# Patient Record
Sex: Female | Born: 1985 | Hispanic: Yes | Marital: Married | Smoking: Never smoker
Health system: Southern US, Community
[De-identification: ages and names within clinical notes are randomized; demographics above are authoritative.]

## PROBLEM LIST (undated history)

## (undated) DIAGNOSIS — K219 Gastro-esophageal reflux disease without esophagitis: Secondary | ICD-10-CM

## (undated) DIAGNOSIS — K819 Cholecystitis, unspecified: Secondary | ICD-10-CM

## (undated) DIAGNOSIS — I10 Essential (primary) hypertension: Secondary | ICD-10-CM

## (undated) HISTORY — PX: NO PAST SURGERIES: SHX2092

---

## 2017-10-23 NOTE — L&D Delivery Note (Addendum)
Delivery Note Pt came in spontaneously laboring and progressed to SVD without augmentation. At 6:17 AM a viable female was delivered via Vaginal, Spontaneous (Presentation: LOA).  APGAR: 9, 9; weight: pending. Infant placed on pt's abd and dried; cord clamped and cut at 1 min; hospital cord blood sample collected. Placenta status: spont, intact .  Cord:  3 vessel  Anesthesia:  epidural Episiotomy: None Lacerations: 1st degree Suture Repair: 3.0 monocryl Est. Blood Loss (mL):    Mom to postpartum.  Baby to Couplet care / Skin to Skin.  Cam Hai CNM 06/23/2018, 6:44 AM  Please schedule this patient for Postpartum visit in: 4 weeks with the following provider: Any provider For C/S patients schedule nurse incision check in weeks 2 weeks: no Low risk pregnancy complicated by: none Delivery mode:  SVD Anticipated Birth Control:  LARC PP Procedures needed: postpartum Pap  Schedule Integrated BH visit: no

## 2017-11-13 ENCOUNTER — Encounter: Payer: Self-pay | Admitting: *Deleted

## 2017-11-24 DIAGNOSIS — H16223 Keratoconjunctivitis sicca, not specified as Sjogren's, bilateral: Secondary | ICD-10-CM | POA: Diagnosis not present

## 2017-11-24 DIAGNOSIS — H40033 Anatomical narrow angle, bilateral: Secondary | ICD-10-CM | POA: Diagnosis not present

## 2017-12-07 ENCOUNTER — Ambulatory Visit (INDEPENDENT_AMBULATORY_CARE_PROVIDER_SITE_OTHER): Payer: Medicaid Other | Admitting: Student

## 2017-12-07 ENCOUNTER — Encounter: Payer: Self-pay | Admitting: Student

## 2017-12-07 ENCOUNTER — Other Ambulatory Visit (HOSPITAL_COMMUNITY)
Admission: RE | Admit: 2017-12-07 | Discharge: 2017-12-07 | Disposition: A | Discharge status: Home / Self Care | Payer: Medicaid Other | Source: Ambulatory Visit | Attending: Student | Admitting: Student

## 2017-12-07 DIAGNOSIS — Z3481 Encounter for supervision of other normal pregnancy, first trimester: Secondary | ICD-10-CM | POA: Insufficient documentation

## 2017-12-07 DIAGNOSIS — Z3482 Encounter for supervision of other normal pregnancy, second trimester: Secondary | ICD-10-CM

## 2017-12-07 DIAGNOSIS — Z3480 Encounter for supervision of other normal pregnancy, unspecified trimester: Secondary | ICD-10-CM | POA: Insufficient documentation

## 2017-12-07 DIAGNOSIS — Z23 Encounter for immunization: Secondary | ICD-10-CM | POA: Diagnosis not present

## 2017-12-07 LAB — POCT URINALYSIS DIP (DEVICE)
Bilirubin Urine: NEGATIVE
GLUCOSE, UA: NEGATIVE mg/dL
Hgb urine dipstick: NEGATIVE
KETONES UR: NEGATIVE mg/dL
Leukocytes, UA: NEGATIVE
Nitrite: NEGATIVE
PROTEIN: NEGATIVE mg/dL
Specific Gravity, Urine: 1.02 (ref 1.005–1.030)
UROBILINOGEN UA: 0.2 mg/dL (ref 0.0–1.0)
pH: 7 (ref 5.0–8.0)

## 2017-12-07 NOTE — Patient Instructions (Signed)
 Lactancia materna Breastfeeding Decidir amamantar es una de las mejores elecciones que puede hacer por usted y su beb. Un cambio en las hormonas durante el embarazo hace que las mamas produzcan leche materna en las glndulas productoras de leche. Las hormonas impiden que la leche materna sea liberada antes del nacimiento del beb. Adems, impulsan el flujo de leche luego del nacimiento. Una vez que ha comenzado a amamantar, pensar en el beb, as como la succin o el llanto, pueden estimular la liberacin de leche de las glndulas productoras de leche. Los beneficios de amamantar Las investigaciones demuestran que la lactancia materna ofrece muchos beneficios de salud para bebs y madres. Adems, ofrece una forma gratuita y conveniente de alimentar al beb. Para el beb  La primera leche (calostro) ayuda a mejorar el funcionamiento del aparato digestivo del beb.  Las clulas especiales de la leche (anticuerpos) ayudan a combatir las infecciones en el beb.  Los bebs que se alimentan con leche materna tambin tienen menos probabilidades de tener asma, alergias, obesidad o diabetes de tipo 2. Adems, tienen menor riesgo de sufrir el sndrome de muerte sbita del lactante (SMSL).  Los nutrientes de la leche materna son mejores para satisfacer las necesidades del beb en comparacin con la leche maternizada.  La leche materna mejora el desarrollo cerebral del beb. Para usted  La lactancia materna favorece el desarrollo de un vnculo muy especial entre la madre y el beb.  Es conveniente. La leche materna es econmica y siempre est disponible a la temperatura correcta.  La lactancia materna ayuda a quemar caloras. Le ayuda a perder el peso ganado durante el embarazo.  Hace que el tero vuelva al tamao que tena antes del embarazo ms rpido. Adems, disminuye el sangrado (loquios) despus del parto.  La lactancia materna contribuye a reducir el riesgo de tener diabetes de tipo 2,  osteoporosis, artritis reumatoide, enfermedades cardiovasculares y cncer de mama, ovario, tero y endometrio en el futuro. Informacin bsica sobre la lactancia Comienzo de la lactancia  Encuentre un lugar cmodo para sentarse o acostarse, con un buen respaldo para el cuello y la espalda.  Coloque una almohada o una manta enrollada debajo del beb para acomodarlo a la altura de la mama (si est sentada). Las almohadas para amamantar se han diseado especialmente a fin de servir de apoyo para los brazos y el beb mientras amamanta.  Asegrese de que la barriga del beb (abdomen) est frente a la suya.  Masajee suavemente la mama. Con las yemas de los dedos, masajee los bordes exteriores de la mama hacia adentro, en direccin al pezn. Esto estimula el flujo de leche. Si la leche fluye lentamente, es posible que deba continuar con este movimiento durante la lactancia.  Sostenga la mama con 4 dedos por debajo y el pulgar por arriba del pezn (forme la letra "C" con la mano). Asegrese de que los dedos se encuentren lejos del pezn y de la boca del beb.  Empuje suavemente los labios del beb con el pezn o con el dedo.  Cuando la boca del beb se abra lo suficiente, acrquelo rpidamente a la mama e introduzca todo el pezn y la arola, tanto como sea posible, dentro de la boca del beb. La arola es la zona de color que rodea al pezn. ? Debe haber ms arola visible por arriba del labio superior del beb que por debajo del labio inferior. ? Los labios del beb deben estar abiertos y extendidos hacia afuera (evertidos) para asegurar que   el beb se prenda de forma adecuada y cmoda. ? La lengua del beb debe estar entre la enca inferior y la mama.  Asegrese de que la boca del beb est en la posicin correcta alrededor del pezn (prendido). Los labios del beb deben crear un sello sobre la mama y estar doblados hacia afuera (invertidos).  Es comn que el beb succione durante 2 a 3 minutos  para que comience el flujo de leche materna. Cmo debe prenderse Es muy importante que le ensee al beb cmo prenderse adecuadamente a la mama. Si el beb no se prende adecuadamente, puede causar dolor en los pezones, reducir la produccin de leche materna y hacer que el beb tenga un escaso aumento de peso. Adems, si el beb no se prende adecuadamente al pezn, puede tragar aire durante la alimentacin. Esto puede causarle molestias al beb. Hacer eructar al beb al cambiar de mama puede ayudarlo a liberar el aire. Sin embargo, ensearle al beb cmo prenderse a la mama adecuadamente es la mejor manera de evitar que se sienta molesto por tragar aire mientras se alimenta. Signos de que el beb se ha prendido adecuadamente al pezn  Tironea o succiona de modo silencioso, sin causarle dolor. Los labios del beb deben estar extendidos hacia afuera (evertidos).  Se escucha que traga cada 3 o 4 succiones una vez que la leche ha comenzado a fluir (despus de que se produzca el reflejo de eyeccin de la leche).  Hay movimientos musculares por arriba y por delante de sus odos al succionar.  Signos de que el beb no se ha prendido adecuadamente al pezn  Hace ruidos de succin o de chasquido mientras se alimenta.  Siente dolor en los pezones.  Si cree que el beb no se prendi correctamente, deslice el dedo en la comisura de la boca y colquelo entre las encas del beb para interrumpir la succin. Intente volver a comenzar a amamantar. Signos de lactancia materna exitosa Signos del beb  El beb disminuir gradualmente el nmero de succiones o dejar de succionar por completo.  El beb se quedar dormido.  El cuerpo del beb se relajar.  El beb retendr una pequea cantidad de leche en la boca.  El beb se desprender solo del pecho.  Signos que presenta usted  Las mamas han aumentado la firmeza, el peso y el tamao 1 a 3 horas despus de amamantar.  Estn ms blandas inmediatamente  despus de amamantar.  Se producen un aumento del volumen de leche y un cambio en su consistencia y color hacia el quinto da de lactancia.  Los pezones no duelen, no estn agrietados ni sangran.  Signos de que su beb recibe la cantidad de leche suficiente  Mojar por lo menos 1 o 2paales durante las primeras 24horas despus del nacimiento.  Mojar por lo menos 5 o 6paales cada 24horas durante la primera semana despus del nacimiento. La orina debe ser clara o de color amarillo plido a los 5das de vida.  Mojar entre 6 y 8paales cada 24horas a medida que el beb sigue creciendo y desarrollndose.  Defeca por lo menos 3 veces en 24 horas a los 5 das de vida. Las heces deben ser blandas y amarillentas.  Defeca por lo menos 3 veces en 24 horas a los 7 das de vida. Las heces deben ser grumosas y amarillentas.  No registra una prdida de peso mayor al 10% del peso al nacer durante los primeros 3 das de vida.  Aumenta de peso un   promedio de 4 a 7onzas (113 a 198g) por semana despus de los 4 das de vida.  Aumenta de peso, diariamente, de manera uniforme a partir de los 5 das de vida, sin registrar prdida de peso despus de las 2semanas de vida. Despus de alimentarse, es posible que el beb regurgite una pequea cantidad de leche. Esto es normal. Frecuencia y duracin de la lactancia El amamantamiento frecuente la ayudar a producir ms leche y puede prevenir dolores en los pezones y las mamas extremadamente llenas (congestin mamaria). Alimente al beb cuando muestre signos de hambre o si siente la necesidad de reducir la congestin de las mamas. Esto se denomina "lactancia a demanda". Las seales de que el beb tiene hambre incluyen las siguientes:  Aumento del estado de alerta, actividad o inquietud.  Mueve la cabeza de un lado a otro.  Abre la boca cuando se le toca la mejilla o la comisura de la boca (reflejo de bsqueda).  Aumenta las vocalizaciones, tales como  sonidos de succin, se relame los labios, emite arrullos, suspiros o chirridos.  Mueve la mano hacia la boca y se chupa los dedos o las manos.  Est molesto o llora.  Evite el uso del chupete en las primeras 4 a 6 semanas despus del nacimiento del beb. Despus de este perodo, podr usar un chupete. Las investigaciones demostraron que el uso del chupete durante el primer ao de vida del beb disminuye el riesgo de tener el sndrome de muerte sbita del lactante (SMSL). Permita que el nio se alimente en cada mama todo lo que desee. Cuando el beb se desprende o se queda dormido mientras se est alimentando de la primera mama, ofrzcale la segunda. Debido a que, con frecuencia, los recin nacidos estn somnolientos las primeras semanas de vida, es posible que deba despertar al beb para alimentarlo. Los horarios de lactancia varan de un beb a otro. Sin embargo, las siguientes reglas pueden servir como gua para ayudarla a garantizar que el beb se alimenta adecuadamente:  Se puede amamantar a los recin nacidos (bebs de 4 semanas o menos de vida) cada 1 a 3 horas.  No deben transcurrir ms de 3 horas durante el da o 5 horas durante la noche sin que se amamante a los recin nacidos.  Debe amamantar al beb un mnimo de 8 veces en un perodo de 24 horas.  Extraccin de leche materna La extraccin y el almacenamiento de la leche materna le permiten asegurarse de que el beb se alimente exclusivamente de su leche materna, aun en momentos en los que no puede amamantar. Esto tiene especial importancia si debe regresar al trabajo en el perodo en que an est amamantando o si no puede estar presente en los momentos en que el beb debe alimentarse. Su asesor en lactancia puede ayudarla a encontrar un mtodo de extraccin que funcione mejor para usted y orientarla sobre cunto tiempo es seguro almacenar leche materna. Cmo cuidar las mamas durante la lactancia Los pezones pueden secarse, agrietarse y  doler durante la lactancia. Las siguientes recomendaciones pueden ayudarla a mantener las mamas humectadas y sanas:  Evite usar jabn en los pezones.  Use un sostn de soporte diseado especialmente para la lactancia materna. Evite usar sostenes con aro o sostenes muy ajustados (sostenes deportivos).  Seque al aire sus pezones durante 3 a 4minutos despus de amamantar al beb.  Utilice solo apsitos de algodn en el sostn para absorber las prdidas de leche. La prdida de un poco de leche materna   entre las tomas es normal.  Utilice lanolina sobre los pezones luego de amamantar. La lanolina ayuda a mantener la humedad normal de la piel. La lanolina pura no es perjudicial (no es txica) para el beb. Adems, puede extraer manualmente algunas gotas de leche materna y masajear suavemente esa leche sobre los pezones para que la leche se seque al aire.  Durante las primeras semanas despus del nacimiento, algunas mujeres experimentan congestin mamaria. La congestin mamaria puede hacer que sienta las mamas pesadas, calientes y sensibles al tacto. El pico de la congestin mamaria ocurre en el plazo de los 3 a 5 das despus del parto. Las siguientes recomendaciones pueden ayudarla a aliviar la congestin mamaria:  Vace por completo las mamas al amamantar o extraer leche. Puede aplicar calor hmedo en las mamas (en la ducha o con toallas hmedas para manos) antes de amamantar o extraer leche. Esto aumenta la circulacin y ayuda a que la leche fluya. Si el beb no vaca por completo las mamas cuando lo amamanta, extraiga la leche restante despus de que haya finalizado.  Aplique compresas de hielo sobre las mamas inmediatamente despus de amamantar o extraer leche, a menos que le resulte demasiado incmodo. Haga lo siguiente: ? Ponga el hielo en una bolsa plstica. ? Coloque una toalla entre la piel y la bolsa de hielo. ? Coloque el hielo durante 20minutos, 2 o 3veces por da.  Asegrese de que el  beb est prendido y se encuentre en la posicin correcta mientras lo alimenta.  Si la congestin mamaria persiste luego de 48 horas o despus de seguir estas recomendaciones, comunquese con su mdico o un asesor en lactancia. Recomendaciones de salud general durante la lactancia  Consuma 3 comidas y 3 colaciones saludables todos los das. Las madres bien alimentadas que amamantan necesitan entre 450 y 500 caloras adicionales por da. Puede cumplir con este requisito al aumentar la cantidad de una dieta equilibrada que realice.  Beba suficiente agua para mantener la orina clara o de color amarillo plido.  Descanse con frecuencia, reljese y siga tomando sus vitaminas prenatales para prevenir la fatiga, el estrs y los niveles bajos de vitaminas y minerales en el cuerpo (deficiencias de nutrientes).  No consuma ningn producto que contenga nicotina o tabaco, como cigarrillos y cigarrillos electrnicos. El beb puede verse afectado por las sustancias qumicas de los cigarrillos que pasan a la leche materna y por la exposicin al humo ambiental del tabaco. Si necesita ayuda para dejar de fumar, consulte al mdico.  Evite el consumo de alcohol.  No consuma drogas ilegales o marihuana.  Antes de usar cualquier medicamento, hable con el mdico. Estos incluyen medicamentos recetados y de venta libre, como tambin vitaminas y suplementos a base de hierbas. Algunos medicamentos, que pueden ser perjudiciales para el beb, pueden pasar a travs de la leche materna.  Puede quedar embarazada durante la lactancia. Si se desea un mtodo anticonceptivo, consulte al mdico sobre cules son las opciones seguras durante la lactancia. Dnde encontrar ms informacin: Liga internacional [place]eche: www.llli.org. Comunquese con un mdico si:  Siente que quiere dejar de amamantar o se siente frustrada con la lactancia.  Sus pezones estn agrietados o sangran.  Sus mamas estn irritadas, sensibles o  calientes.  Tiene los siguientes sntomas: ? Dolor en las mamas o en los pezones. ? Un rea hinchada en cualquiera de las mamas. ? Fiebre o escalofros. ? Nuseas o vmitos. ? Drenaje de otro lquido distinto de la leche materna desde los pezones.    Sus mamas no se llenan antes de amamantar al beb para el quinto da despus del parto.  Se siente triste y deprimida.  El beb: ? Est demasiado somnoliento como para comer bien. ? Tiene problemas para dormir. ? Tiene ms de 1 semana de vida y moja menos de 6 paales en un periodo de 24 horas. ? No ha aumentado de peso a los 5 das de vida.  El beb defeca menos de 3 veces en 24 horas.  La piel del beb o las partes blancas de los ojos se vuelven amarillentas. Solicite ayuda de inmediato si:  El beb est muy cansado (letargo) y no se quiere despertar para comer.  Le sube la fiebre sin causa. Resumen  La lactancia materna ofrece muchos beneficios de salud para bebs y madres.  Intente amamantar a su beb cuando muestre signos tempranos de hambre.  Haga cosquillas o empuje suavemente los labios del beb con el dedo o el pezn para lograr que el beb abra la boca. Acerque el beb a la mama. Asegrese de que la mayor parte de la arola se encuentre dentro de la boca del beb. Ofrzcale una mama y haga eructar al beb antes de pasar a la otra.  Hable con su mdico o asesor en lactancia si tiene dudas o problemas con la lactancia. Esta informacin no tiene como fin reemplazar el consejo del mdico. Asegrese de hacerle al mdico cualquier pregunta que tenga. Document Released: 10/09/2005 Document Revised: 01/29/2017 Document Reviewed: 01/29/2017 Elsevier Interactive Patient Education  2018 Elsevier Inc.  

## 2017-12-07 NOTE — Progress Notes (Signed)
  Subjective:    Loretta [name] is being seen today for her first obstetrical visit.  This is not a planned pregnancy. She is at [id number] gestation. Her obstetrical history is significant for nothing. . Relationship with FOB: spouse, living together. Patient does intend to breast feed. Pregnancy history fully reviewed.  Patient reports no complaints.  Review of Systems:   Review of Systems  Constitutional: Negative.   HENT: Negative.   Respiratory: Negative.   Cardiovascular: Negative.   Gastrointestinal: Negative.   Genitourinary: Negative.   Musculoskeletal: Negative.   Neurological: Negative.   Hematological: Negative.     Objective:     BP 129/77   Pulse (!) 108   Ht 4\' 11"  (1.499 m)   Wt 132 lb 6.4 oz (60.1 kg)   LMP 09/10/2017 (Exact Date)   BMI 26.74 kg/m  Physical Exam  Constitutional: She is oriented to person, place, and time. She appears well-developed.  HENT:  Head: Normocephalic.  Eyes: Pupils are equal, round, and reactive to light.  Neck: Normal range of motion.  Respiratory: Effort normal.  GI: Soft.  Musculoskeletal: Normal range of motion.  Neurological: She is alert and oriented to person, place, and time.  Skin: Skin is warm and dry.  Psychiatric: She has a normal mood and affect.    Exam    Assessment:    Pregnancy: [id number] Patient Active Problem List   Diagnosis Date Noted  . Supervision of other normal pregnancy, antepartum 12/07/2017       Plan:     Initial labs drawn. Prenatal vitamins. Problem list reviewed and updated. AFP3 discussed: NIPS  Role of ultrasound in pregnancy discussed; fetal survey: ordered. Amniocentesis discussed: not indicated. Follow up in 8 weeks. 50% of 30 min visit spent on counseling and coordination of care.  -Pap today -oriented patient to practice -reviewed prenatal course in [place] -baby RX optimization -HgbA1c -Follow up in 4 weeks Loretta [name] 12/07/2017

## 2017-12-10 LAB — URINE CULTURE, OB REFLEX

## 2017-12-10 LAB — CULTURE, OB URINE

## 2017-12-11 LAB — CYTOLOGY - PAP
Chlamydia: NEGATIVE
Diagnosis: NEGATIVE
HPV: DETECTED — AB
NEISSERIA GONORRHEA: NEGATIVE

## 2017-12-12 ENCOUNTER — Encounter: Payer: Self-pay | Admitting: *Deleted

## 2017-12-17 ENCOUNTER — Encounter: Payer: Self-pay | Admitting: *Deleted

## 2017-12-17 LAB — OBSTETRIC PANEL, INCLUDING HIV
Antibody Screen: NEGATIVE
Basophils Absolute: 0 10*3/uL (ref 0.0–0.2)
Basos: 0 %
EOS (ABSOLUTE): 0 10*3/uL (ref 0.0–0.4)
EOS: 0 %
HEMOGLOBIN: 12.1 g/dL (ref 11.1–15.9)
HEP B S AG: NEGATIVE
HIV SCREEN 4TH GENERATION: NONREACTIVE
Hematocrit: 36.5 % (ref 34.0–46.6)
Immature Grans (Abs): 0 10*3/uL (ref 0.0–0.1)
Immature Granulocytes: 0 %
LYMPHS ABS: 1.7 10*3/uL (ref 0.7–3.1)
Lymphs: 22 %
MCH: 28.8 pg (ref 26.6–33.0)
MCHC: 33.2 g/dL (ref 31.5–35.7)
MCV: 87 fL (ref 79–97)
MONOS ABS: 0.3 10*3/uL (ref 0.1–0.9)
Monocytes: 4 %
NEUTROS ABS: 5.5 10*3/uL (ref 1.4–7.0)
Neutrophils: 74 %
Platelets: 358 10*3/uL (ref 150–379)
RBC: 4.2 x10E6/uL (ref 3.77–5.28)
RDW: 13.6 % (ref 12.3–15.4)
RH TYPE: POSITIVE
RPR: NONREACTIVE
Rubella Antibodies, IGG: 16.7 index (ref >0.99)
WBC: 7.5 10*3/uL (ref 3.4–10.8)

## 2017-12-17 LAB — SMN1 COPY NUMBER ANALYSIS (SMA CARRIER SCREENING)

## 2017-12-17 LAB — HEMOGLOBINOPATHY EVALUATION
Ferritin: 86 ng/mL (ref 15–150)
HGB A2 QUANT: 2.1 % (ref 1.8–3.2)
HGB A: 97.1 % (ref 96.4–98.8)
HGB F QUANT: 0.8 % (ref 0.0–2.0)
HGB S: 0 %
HGB SOLUBILITY: NEGATIVE
Hgb C: 0 %
Hgb Variant: 0 %

## 2017-12-17 LAB — HEMOGLOBIN A1C
Est. average glucose Bld gHb Est-mCnc: 103 mg/dL
Hgb A1c MFr Bld: 5.2 % (ref 4.8–5.6)

## 2017-12-27 ENCOUNTER — Other Ambulatory Visit (HOSPITAL_COMMUNITY)
Admission: RE | Admit: 2017-12-27 | Discharge: 2017-12-27 | Disposition: A | Discharge status: Home / Self Care | Payer: Medicaid Other | Source: Ambulatory Visit | Attending: Family Medicine | Admitting: Family Medicine

## 2017-12-27 ENCOUNTER — Ambulatory Visit: Payer: Medicaid Other | Admitting: Family Medicine

## 2017-12-27 ENCOUNTER — Other Ambulatory Visit: Payer: Self-pay

## 2017-12-27 ENCOUNTER — Encounter: Payer: Self-pay | Admitting: Family Medicine

## 2017-12-27 VITALS — BP 110/60 | HR 104 | Temp 98.7°F | Ht 62.5 in | Wt 136.0 lb

## 2017-12-27 DIAGNOSIS — N898 Other specified noninflammatory disorders of vagina: Secondary | ICD-10-CM | POA: Insufficient documentation

## 2017-12-27 DIAGNOSIS — K219 Gastro-esophageal reflux disease without esophagitis: Secondary | ICD-10-CM | POA: Diagnosis not present

## 2017-12-27 DIAGNOSIS — Z0000 Encounter for general adult medical examination without abnormal findings: Secondary | ICD-10-CM

## 2017-12-27 LAB — POCT WET PREP (WET MOUNT)
CLUE CELLS WET PREP WHIFF POC: NEGATIVE
TRICHOMONAS WET PREP HPF POC: ABSENT

## 2017-12-27 MED ORDER — NYSTATIN 100000 UNIT/GM EX OINT
1.0000 | TOPICAL_OINTMENT | Freq: Two times a day (BID) | CUTANEOUS | 0 refills | Status: DC
Start: 2017-12-27 — End: 2018-03-12

## 2017-12-27 MED ORDER — RANITIDINE HCL 150 MG PO CAPS: 150.0000 mg | ORAL_CAPSULE | Freq: Two times a day (BID) | ORAL | 1 refills | Status: DC

## 2017-12-27 NOTE — Assessment & Plan Note (Signed)
  Advised discontinuation of PPI in pregnancy Rx sent in for ranitidine to pharmacy to use BID Can also try tums OTC Follow up with OB as scheduled next week

## 2017-12-27 NOTE — Patient Instructions (Addendum)
Fue un placer conocerte hoy! Por favor, tome la medicacin prescrita para el reflujo cido dos veces al da. Tambin puede tomar TUMS si tiene reflujo seguro durante el embarazo Envi una crema para usar en la piel alrededor de la vagina donde pica. Por favor haga un seguimiento con su proveedor de OB segn lo programado. Si tiene alguna pregunta o inquietud, llmenos al [phone].  Gracias, Dr. Andria Rheiniccio   Segundo trimestre de embarazo (Second Trimester of Pregnancy) El segundo trimestre va desde la semana13 hasta la 28, desde el cuarto hasta el sexto mes, y suele ser el momento en el que mejor se siente. En general, las nuseas matutinas han disminuido o han desaparecido completamente. Tendr ms energa y podr aumentarle el apetito. El beb por nacer (feto) se desarrolla rpidamente. Hacia el final del sexto mes, el beb mide aproximadamente 9 pulgadas (23 cm) y pesa alrededor de 1 libras (700 g). Es probable que sienta al beb moverse (dar pataditas) entre las 18 y 20 semanas del Psychiatristembarazo. CUIDADOS EN EL HOGAR  No fume, no consuma hierbas ni beba alcohol. No tome frmacos que el mdico no haya autorizado.  No consuma ningn producto que contenga tabaco, lo que incluye cigarrillos, tabaco de Theatre managermascar o Administrator, Civil Servicecigarrillos electrnicos. Si necesita ayuda para dejar de fumar, consulte al American Expressmdico. Puede recibir asesoramiento u otro tipo de apoyo para dejar de fumar.  Tome los medicamentos solamente como se lo haya indicado el mdico. Algunos medicamentos son seguros para tomar durante el Psychiatristembarazo y otros no lo son.  Haga ejercicios solamente como se lo haya indicado el mdico. Interrumpa la actividad fsica si comienza a tener calambres.  Ingiera alimentos saludables de Plantationmanera regular.  Use un sostn que le brinde buen soporte si sus mamas estn sensibles.  No se d baos de inmersin en agua caliente, baos turcos ni saunas.  Colquese el cinturn de seguridad cuando conduzca.  No coma  carne cruda ni queso sin cocinar; evite el contacto con las bandejas sanitarias de los gatos y la tierra que estos animales usan.  Tome las vitaminas prenatales.  Tome entre 1500 y 2000mg  de calcio diariamente comenzando en la semana20 del embarazo [place] el parto.  Pruebe tomar un medicamento que la ayude a defecar (un laxante suave) si el mdico lo autoriza. Consuma ms fibra, que se encuentra en las frutas y verduras frescas y los cereales integrales. Beba suficiente lquido para mantener el pis (orina) claro o de color amarillo plido.  Dese baos de asiento con agua tibia para Engineer, materialsaliviar el dolor o las molestias causadas por las hemorroides. Use una crema para las hemorroides si el mdico la autoriza.  Si se le hinchan las venas (venas varicosas), use medias de descanso. Levante (eleve) los pies durante 15minutos, 3 o 4veces por Futures traderda. Limite el consumo de sal en su dieta.  No levante objetos pesados, use zapatos de tacones bajos y sintese derecha.  Descanse con las piernas elevadas si tiene calambres o dolor de cintura.  Visite a su dentista si no lo ha Occupational hygienisthecho durante el embarazo. Use un cepillo de cerdas suaves para cepillarse los dientes. Psese el hilo dental con suavidad.  Puede seguir Calpine Corporationmanteniendo relaciones sexuales, a menos que el mdico le indique lo contrario.  Concurra a los controles mdicos.  SOLICITE AYUDA SI:  Siente mareos.  Sufre calambres o presin leves en la parte baja del vientre (abdomen).  Sufre un dolor persistente en el abdomen.  Tiene Programme researcher, broadcasting/film/videomalestar estomacal (nuseas), vmitos, o tiene deposiciones acuosas (diarrea).  Advierte un olor ftido que proviene de la vagina.  Siente dolor al [employer].  SOLICITE AYUDA DE INMEDIATO SI:  Tiene fiebre.  Tiene una prdida de lquido por la vagina.  Tiene sangrado o pequeas prdidas vaginales.  Siente dolor intenso o clicos en el abdomen.  Sube o baja de peso rpidamente.  Tiene dificultades para recuperar el  aliento y siente dolor en el pecho.  Sbitamente se le hinchan mucho el rostro, las [place], los tobillos, los pies o las piernas.  No ha sentido los movimientos del beb durante Georgianne Fick.  Siente un dolor de cabeza intenso que no se alivia con medicamentos.  Su visin se modifica.  Esta informacin no tiene Theme park manager el consejo del mdico. Asegrese de hacerle al mdico cualquier pregunta que tenga. Document Released: 06/11/2013 Document Revised: 10/30/2014 Document Reviewed: 12/10/2012 Elsevier Interactive Patient Education  2017 [employer].

## 2017-12-27 NOTE — Assessment & Plan Note (Signed)
  Wet prep positive only for bacteria. No urinary symptoms. Sent Gc/chalmydia to lab. Advised use of antifungal cream on skin around vagina that is itchy to patient, however I did not appreciate any lesions or rashes on my exam. She has appointment with OB next week, advised her to discuss this with them if symptoms persist. Antifungal ointment sent to pharmacy.

## 2017-12-27 NOTE — Progress Notes (Signed)
    Subjective:    Patient ID: Loretta [name], female    DOB: 28-Nov-1985, 32 y.o.   MRN: [id number]   CC: establish care  Currently pregnant. [facility]   PMH- reflux Meds- PPI  Allergies- none Surg Hx- none FH- none that she knows of [facility]- lives with her daughters and husband, no tobacco, alcohol, or drug use  GERD- taking PPI lanzoprazole - w/o relief. Denies using or trying other medications.   Vaginal itching- for past couple of days. Reports itching is around her vagina. Endorses discharge. No dysuria. No pain. No vaginal bleeding. This has happened before, but she can't remember what happened at that time.  Smoking status reviewed- non-smoker  Review of Systems- see HPI   Objective:  BP 110/60   Pulse (!) 104   Temp 98.7 F (37.1 C) (Oral)   Ht 5' 2.5" (1.588 m)   Wt 136 lb (61.7 kg)   LMP 09/10/2017 (Exact Date)   SpO2 99%   BMI 24.48 kg/m  Vitals and nursing note reviewed  General: well nourished, in no acute distress HEENT: normocephalic, no scleral icterus or conjunctival pallor, no nasal discharge, moist mucous membranes, good dentition without erythema or discharge noted in posterior oropharynx Neck: supple, non-tender, without lymphadenopathy Cardiac: RRR, clear S1 and S2, no murmurs, rubs, or gallops Respiratory: clear to auscultation bilaterally, no increased work of breathing Abdomen: soft, nontender, nondistended, no masses or organomegaly. Bowel sounds present GU: normal female external genitalia, no rashes or lesions appreciated, moderate amount of thick white discharge present, cervix visualized closed, no CMT Extremities: no edema or cyanosis. Skin: warm and dry, no rashes noted Neuro: alert and oriented, no focal deficits   Assessment & Plan:    Vaginal discharge  Wet prep positive only for bacteria. No urinary symptoms. Sent Gc/chalmydia to lab. Advised use of antifungal cream on skin  around vagina that is itchy to patient, however I did not appreciate any lesions or rashes on my exam. She has appointment with OB next week, advised her to discuss this with them if symptoms persist. Antifungal ointment sent to pharmacy.   Gastroesophageal reflux disease  Advised discontinuation of PPI in pregnancy Rx sent in for ranitidine to pharmacy to use BID Can also try tums OTC Follow up with OB as scheduled next week    Return in about 1 year (around 12/28/2018), or as needed.   Dolores PattyAngela Braylin Xu, DO Family Medicine Resident PGY-2

## 2017-12-29 LAB — CERVICOVAGINAL ANCILLARY ONLY
CHLAMYDIA, DNA PROBE: NEGATIVE
NEISSERIA GONORRHEA: NEGATIVE

## 2017-12-31 ENCOUNTER — Encounter: Payer: Self-pay | Admitting: *Deleted

## 2017-12-31 NOTE — Progress Notes (Signed)
Please let Ms. Avalos know her testing was negative for gonorrhea/chlamydia. Thanks!

## 2018-01-03 ENCOUNTER — Encounter: Payer: Medicaid Other | Admitting: Student

## 2018-01-07 ENCOUNTER — Encounter (HOSPITAL_COMMUNITY): Payer: Self-pay | Admitting: Student

## 2018-01-14 ENCOUNTER — Ambulatory Visit (INDEPENDENT_AMBULATORY_CARE_PROVIDER_SITE_OTHER): Payer: Medicaid Other | Admitting: Advanced Practice Midwife

## 2018-01-14 ENCOUNTER — Other Ambulatory Visit: Payer: Self-pay | Admitting: Student

## 2018-01-14 ENCOUNTER — Ambulatory Visit (HOSPITAL_COMMUNITY)
Admission: RE | Admit: 2018-01-14 | Discharge: 2018-01-14 | Disposition: A | Discharge status: Home / Self Care | Payer: Medicaid Other | Source: Ambulatory Visit | Attending: Student | Admitting: Student

## 2018-01-14 ENCOUNTER — Encounter: Payer: Self-pay | Admitting: Advanced Practice Midwife

## 2018-01-14 VITALS — BP 128/80 | HR 102 | Wt 136.0 lb

## 2018-01-14 DIAGNOSIS — Z3492 Encounter for supervision of normal pregnancy, unspecified, second trimester: Secondary | ICD-10-CM | POA: Insufficient documentation

## 2018-01-14 DIAGNOSIS — Z3480 Encounter for supervision of other normal pregnancy, unspecified trimester: Secondary | ICD-10-CM

## 2018-01-14 DIAGNOSIS — Z363 Encounter for antenatal screening for malformations: Secondary | ICD-10-CM | POA: Diagnosis not present

## 2018-01-14 DIAGNOSIS — Z3A18 18 weeks gestation of pregnancy: Secondary | ICD-10-CM | POA: Insufficient documentation

## 2018-01-14 DIAGNOSIS — O359XX0 Maternal care for (suspected) fetal abnormality and damage, unspecified, not applicable or unspecified: Secondary | ICD-10-CM | POA: Diagnosis not present

## 2018-01-14 NOTE — Progress Notes (Signed)
Patient ID: Loretta [name], female   DOB: [birth year], 32 y.o.   MRN: [id number]   PRENATAL VISIT NOTE  Subjective:  Loretta [name] is a 32 y.o. G3P2002 at 4628w0d being seen today for ongoing prenatal care.  She is currently monitored for the following issues for this low-risk pregnancy and has Supervision of other normal pregnancy, antepartum; Vaginal discharge; and Gastroesophageal reflux disease on their problem list.  Patient reports no complaints.  Contractions: Not present. Vag. Bleeding: None.  Movement: Present. Denies leaking of fluid.   The following portions of the patient's history were reviewed and updated as appropriate: allergies, current medications, past family history, past medical history, past social history, past surgical history and problem list. Problem list updated.  Objective:   Vitals:   01/14/18 0910  BP: 128/80  Pulse: (!) 102  Weight: 136 lb (61.7 kg)    Fetal Status:   Fundal Height: 18 cm Movement: Present     General:  Alert, oriented and cooperative. Patient is in no acute distress.  Skin: Skin is warm and dry. No rash noted.   Cardiovascular: Normal heart rate noted  Respiratory: Normal respiratory effort, no problems with respiration noted  Abdomen: Soft, gravid, appropriate for gestational age.  Pain/Pressure: Present     Pelvic: Cervical exam deferred        Extremities: Normal range of motion.  Edema: None  Mental Status:  Normal mood and affect. Normal behavior. Normal judgment and thought content.   Assessment and Plan:  Pregnancy: G3P2002 at 7128w0d  1. Supervision of other normal pregnancy, antepartum  - AFP, Serum, Open Spina Bifida - US scheduled   Preterm labor symptoms and general obstetric precautions including but not limited to vaginal bleeding, contractions, leaking of fluid and fetal movement were reviewed in detail with the patient. Please refer to After Visit Summary for other counseling  recommendations.  Return in about 1 month (around 02/11/2018).   Thressa ShellerHeather Hogan, CNM

## 2018-01-14 NOTE — Progress Notes (Signed)
Spanish Interpreter Cicero Duckrika  Educated pt on Breastfeeding for Six months

## 2018-01-15 ENCOUNTER — Ambulatory Visit: Payer: Medicaid Other

## 2018-01-16 LAB — AFP, SERUM, OPEN SPINA BIFIDA
AFP MoM: 0.69
AFP VALUE AFPOSL: 31.2 ng/mL
Gest. Age on Collection Date: 18 weeks
MATERNAL AGE AT EDD: 31.1 a
OSBR Risk 1 IN: 10000
Test Results:: NEGATIVE
Weight: 136 [lb_av]

## 2018-02-12 ENCOUNTER — Ambulatory Visit (INDEPENDENT_AMBULATORY_CARE_PROVIDER_SITE_OTHER): Payer: Medicaid Other | Admitting: Obstetrics and Gynecology

## 2018-02-12 VITALS — BP 112/69 | HR 102 | Wt 138.3 lb

## 2018-02-12 DIAGNOSIS — Z3480 Encounter for supervision of other normal pregnancy, unspecified trimester: Secondary | ICD-10-CM

## 2018-02-12 DIAGNOSIS — Z3009 Encounter for other general counseling and advice on contraception: Secondary | ICD-10-CM

## 2018-02-12 DIAGNOSIS — Z758 Other problems related to medical facilities and other health care: Secondary | ICD-10-CM

## 2018-02-12 DIAGNOSIS — Z789 Other specified health status: Secondary | ICD-10-CM | POA: Insufficient documentation

## 2018-02-12 NOTE — Patient Instructions (Signed)
Lo que debe saber sobre la esterilizacin en las mujeres (What You Need to Know About Female Sterilization) La esterilizacin en la mujer es una ciruga que se realiza para evitar el embarazo. En esta ciruga, las trompas de Falopio se obstruyen o se cierran. Esto evita que los vulos lleguen al tero y que los fecunde un espermatozoide; en consecuencia, se previene el embarazo. La esterilizacin es permanente. Solo debe hacerse si est segura de que no desea tener hijos. CULES SON LAS OPCIONES QUIRRGICAS PARA LA ESTERILIZACIN? Hay varias clases de cirugas para esterilizacin en la mujer. Estos incluyen los siguientes:  Ligadura laparoscpica de trompas. En esta ciruga, las trompas de Falopio se atan, se sellan con calor o se obstruyen con un clip, un anillo o una abrazadera. Tambin puede extraerse una parte pequea de cada trompa de Falopio. Esta ciruga se realiza a travs de varios cortes (incisiones) pequeos.  Ligadura de trompas posparto. Esto tambin se denomina minilaparotoma. La ciruga se realiza de inmediato despus del nacimiento, o 1o 2das despus del nacimiento. En esta ciruga, las trompas de Falopio se atan, se sellan con calor o se obstruyen con un clip, un anillo o una abrazadera. Tambin puede extraerse una parte pequea de cada trompa de Falopio. La ciruga se realiza a travs de una sola incisin.  Esterilizacin histeroscpica. En esta ciruga, se coloca un diminuto resorte helicoidal a travs del cuello uterino y el tero hasta las trompas de Falopio. El resorte produce cicatrizacin patolgica, lo que obstruye las trompas. Despus de la ciruga, se deben utilizar anticonceptivos durante 3meses para permitir que el tejido cicatricial se forme por completo. LA ESTERILIZACIN ES UN PROCEDIMIENTO SEGURO? En general, la esterilizacin es segura. Las complicaciones son raras. No obstante, hay algunos riesgos. Estos incluyen los  siguientes:  Hemorragia.  Infeccin.  Reaccin al medicamento utilizado durante el procedimiento.  Lesin en los rganos circundantes.  Fallas en el procedimiento. LA ESTERILIZACIN ES UN MTODO EFECTIVO? La esterilizacin es efectiva en casi 100%, pero puede fallar. Adems, con el tiempo, las trompas de Falopio pueden volver a desarrollarse. Si esto sucede, podr quedar embarazada nuevamente. Los mujeres que se realizan este procedimiento tienen ms probabilidades de sufrir un embarazo ectpico. El embarazo ectpico es aquel que se produce fuera del tero. Esta clase de embarazo no tiene resultados satisfactorios y puede causar sangrado grave si no se trata. CULES SON LOS BENEFICIOS?  En general, es efectiva para toda la vida.  Suele ser segura.  No presenta los inconvenientes de otros tipos de mtodos anticonceptivos: Esto significa lo siguiente: ? No afecta a las hormonas. Por lo tanto, no influye en los perodos menstruales, en el deseo ni en el rendimiento sexual. ? No tiene efectos secundarios. CULES SON LOS INCONVENIENTES?  Si cambia de parecer y decide que quiere tener hijos, no podr hacerlo. La esterilizacin puede revertirse, pero esto no siempre es exitoso.  No ofrece proteccin contra las ETS (enfermedades de transmisin sexual).  Aumenta la probabilidad de tener un embarazo ectpico. Esta informacin no tiene como fin reemplazar el consejo del mdico. Asegrese de hacerle al mdico cualquier pregunta que tenga. Document Released: 03/27/2008 Document Revised: 01/31/2016 Document Reviewed: 07/06/2015 Elsevier Interactive Patient Education  2018 Elsevier Inc.  

## 2018-02-12 NOTE — Progress Notes (Signed)
Subjective:  Loretta [name] is a 32 y.o. G3P2002 at 5269w1d being seen today for ongoing prenatal care.  She is currently monitored for the following issues for this low-risk pregnancy and has Supervision of other normal pregnancy, antepartum; Vaginal discharge; Gastroesophageal reflux disease; Unwanted fertility; and Language barrier on their problem list.  Patient reports no complaints.  Contractions: Not present. Vag. Bleeding: None.  Movement: Present. Denies leaking of fluid.   The following portions of the patient's history were reviewed and updated as appropriate: allergies, current medications, past family history, past medical history, past social history, past surgical history and problem list. Problem list updated.  Objective:   Vitals:   02/12/18 0832  BP: 112/69  Pulse: (!) 102  Weight: 62.7 kg (138 lb 4.8 oz)    Fetal Status: Fetal Heart Rate (bpm): 145 Fundal Height: 21 cm Movement: Present     General:  Alert, oriented and cooperative. Patient is in no acute distress.  Skin: Skin is warm and dry. No rash noted.   Cardiovascular: Normal heart rate noted  Respiratory: Normal respiratory effort, no problems with respiration noted  Abdomen: Soft, gravid, appropriate for gestational age. Pain/Pressure: Absent     Pelvic: Vag. Bleeding: None     Cervical exam deferred        Extremities: Normal range of motion.  Edema: None  Mental Status: Normal mood and affect. Normal behavior. Normal judgment and thought content.   Urinalysis:      Assessment and Plan:  Pregnancy: G3P2002 at 4469w1d  1. Supervision of other normal pregnancy, antepartum Doing well. Continue routine care. Order placed for follow-up anatomy scan.  - US MFM OB FOLLOW UP; Future  2. Unwanted fertility Desires BTL. Papers signed today.   3. Language barrier Video spanish interpretor used throughout encounter.    Preterm labor symptoms and general obstetric precautions including but not  limited to vaginal bleeding, contractions, leaking of fluid and fetal movement were reviewed in detail with the patient. Please refer to After Visit Summary for other counseling recommendations.  Return in about 1 month (around 03/12/2018) for ob visit.   Pincus LargePhelps, Wei Newbrough Y, DO

## 2018-02-19 ENCOUNTER — Other Ambulatory Visit: Payer: Self-pay | Admitting: Obstetrics and Gynecology

## 2018-02-19 ENCOUNTER — Ambulatory Visit (HOSPITAL_COMMUNITY)
Admission: RE | Admit: 2018-02-19 | Discharge: 2018-02-19 | Disposition: A | Discharge status: Home / Self Care | Payer: Medicaid Other | Source: Ambulatory Visit | Attending: Obstetrics and Gynecology | Admitting: Obstetrics and Gynecology

## 2018-02-19 DIAGNOSIS — Z3A23 23 weeks gestation of pregnancy: Secondary | ICD-10-CM | POA: Insufficient documentation

## 2018-02-19 DIAGNOSIS — Z3480 Encounter for supervision of other normal pregnancy, unspecified trimester: Secondary | ICD-10-CM

## 2018-02-19 DIAGNOSIS — Z3482 Encounter for supervision of other normal pregnancy, second trimester: Secondary | ICD-10-CM | POA: Diagnosis present

## 2018-02-19 DIAGNOSIS — Z362 Encounter for other antenatal screening follow-up: Secondary | ICD-10-CM

## 2018-02-22 ENCOUNTER — Encounter: Payer: Self-pay | Admitting: *Deleted

## 2018-02-26 ENCOUNTER — Encounter: Payer: Self-pay | Admitting: General Practice

## 2018-03-04 ENCOUNTER — Encounter: Payer: Self-pay | Admitting: Medical

## 2018-03-12 ENCOUNTER — Ambulatory Visit (INDEPENDENT_AMBULATORY_CARE_PROVIDER_SITE_OTHER): Payer: Medicaid Other | Admitting: Medical

## 2018-03-12 VITALS — BP 115/61 | HR 97 | Wt 140.6 lb

## 2018-03-12 DIAGNOSIS — Z3480 Encounter for supervision of other normal pregnancy, unspecified trimester: Secondary | ICD-10-CM

## 2018-03-12 DIAGNOSIS — Z3482 Encounter for supervision of other normal pregnancy, second trimester: Secondary | ICD-10-CM

## 2018-03-12 NOTE — Patient Instructions (Signed)
Second Trimester of Pregnancy The second trimester is from week 13 through week 28, month 4 through 6. This is often the time in pregnancy that you feel your best. Often times, morning sickness has lessened or quit. You may have more energy, and you may get hungry more often. Your unborn baby (fetus) is growing rapidly. At the end of the sixth month, he or she is about 9 inches long and weighs about 1 pounds. You will likely feel the baby move (quickening) between 18 and 20 weeks of pregnancy. Follow these instructions at home:  Avoid all smoking, herbs, and alcohol. Avoid drugs not approved by your doctor.  Do not use any tobacco products, including cigarettes, chewing tobacco, and electronic cigarettes. If you need help quitting, ask your doctor. You may get counseling or other support to help you quit.  Only take medicine as told by your doctor. Some medicines are safe and some are not during pregnancy.  Exercise only as told by your doctor. Stop exercising if you start having cramps.  Eat regular, healthy meals.  Wear a good support bra if your breasts are tender.  Do not use hot tubs, steam rooms, or saunas.  Wear your seat belt when driving.  Avoid raw meat, uncooked cheese, and liter boxes and soil used by cats.  Take your prenatal vitamins.  Take 1500-2000 milligrams of calcium daily starting at the 20th week of pregnancy until you deliver your baby.  Try taking medicine that helps you poop (stool softener) as needed, and if your doctor approves. Eat more fiber by eating fresh fruit, vegetables, and whole grains. Drink enough fluids to keep your pee (urine) clear or pale yellow.  Take warm water baths (sitz baths) to soothe pain or discomfort caused by hemorrhoids. Use hemorrhoid cream if your doctor approves.  If you have puffy, bulging veins (varicose veins), wear support hose. Raise (elevate) your feet for 15 minutes, 3-4 times a day. Limit salt in your diet.  Avoid heavy  lifting, wear low heals, and sit up straight.  Rest with your legs raised if you have leg cramps or low back pain.  Visit your dentist if you have not gone during your pregnancy. Use a soft toothbrush to brush your teeth. Be gentle when you floss.  You can have sex (intercourse) unless your doctor tells you not to.  Go to your doctor visits. Get help if:  You feel dizzy.  You have mild cramps or pressure in your lower belly (abdomen).  You have a nagging pain in your belly area.  You continue to feel sick to your stomach (nauseous), throw up (vomit), or have watery poop (diarrhea).  You have bad smelling fluid coming from your vagina.  You have pain with peeing (urination). Get help right away if:  You have a fever.  You are leaking fluid from your vagina.  You have spotting or bleeding from your vagina.  You have severe belly cramping or pain.  You lose or gain weight rapidly.  You have trouble catching your breath and have chest pain.  You notice sudden or extreme puffiness (swelling) of your face, hands, ankles, feet, or legs.  You have not felt the baby move in over an hour.  You have severe headaches that do not go away with medicine.  You have vision changes. This information is not intended to replace advice given to you by your health care provider. Make sure you discuss any questions you have with your health care   provider. Document Released: 01/03/2010 Document Revised: 03/16/2016 Document Reviewed: 12/10/2012 Elsevier Interactive Patient Education  2017 Elsevier Inc.  

## 2018-03-12 NOTE — Progress Notes (Signed)
   PRENATAL VISIT NOTE  Subjective:  Loretta [name] is a 32 y.o. G3P2002 at [redacted]w[redacted]d being seen today for ongoing prenatal care.  She is currently monitored for the following issues for this low-risk pregnancy and has Supervision of other normal pregnancy, antepartum; Gastroesophageal reflux disease; Unwanted fertility; and Language barrier on their problem list.  Patient reports no complaints.  Contractions: Not present. Vag. Bleeding: None.  Movement: Present. Denies leaking of fluid.   The following portions of the patient's history were reviewed and updated as appropriate: allergies, current medications, past family history, past medical history, past social history, past surgical history and problem list. Problem list updated.  Objective:   Vitals:   03/12/18 1128  BP: 115/61  Pulse: 97  Weight: 140 lb 9.6 oz (63.8 kg)    Fetal Status: Fetal Heart Rate (bpm): 140 Fundal Height: 26 cm Movement: Present     General:  Alert, oriented and cooperative. Patient is in no acute distress.  Skin: Skin is warm and dry. No rash noted.   Cardiovascular: Normal heart rate noted  Respiratory: Normal respiratory effort, no problems with respiration noted  Abdomen: Soft, gravid, appropriate for gestational age.  Pain/Pressure: Present     Pelvic: Cervical exam deferred        Extremities: Normal range of motion.  Edema: None  Mental Status: Normal mood and affect. Normal behavior. Normal judgment and thought content.   Assessment and Plan:  Pregnancy: G3P2002 at [redacted]w[redacted]d  1. Supervision of other normal pregnancy, antepartum - Doing well - Patient states some breast fullness and milky discharge - advised to avoid promoting breast discharge to avoid preterm contractions  - Advised of need for 2 hour GTT at next visit  Preterm labor symptoms and general obstetric precautions including but not limited to vaginal bleeding, contractions, leaking of fluid and fetal movement were  reviewed in detail with the patient. Please refer to After Visit Summary for other counseling recommendations.  Return in about 2 weeks (around 03/26/2018) for LOB, 28 week labs (fasting).  Future Appointments  Date Time Provider Department Center  04/12/2018 10:55 AM Marny Lowenstein, PA-C [facility]    Vonzella Nipple, PA-C

## 2018-03-28 ENCOUNTER — Ambulatory Visit (INDEPENDENT_AMBULATORY_CARE_PROVIDER_SITE_OTHER): Payer: Medicaid Other | Admitting: Nurse Practitioner

## 2018-03-28 ENCOUNTER — Encounter: Payer: Self-pay | Admitting: Nurse Practitioner

## 2018-03-28 ENCOUNTER — Other Ambulatory Visit: Payer: Medicaid Other

## 2018-03-28 ENCOUNTER — Other Ambulatory Visit: Payer: Self-pay | Admitting: General Practice

## 2018-03-28 VITALS — BP 106/60 | HR 85 | Wt 142.1 lb

## 2018-03-28 DIAGNOSIS — K219 Gastro-esophageal reflux disease without esophagitis: Secondary | ICD-10-CM

## 2018-03-28 DIAGNOSIS — Z23 Encounter for immunization: Secondary | ICD-10-CM | POA: Diagnosis not present

## 2018-03-28 DIAGNOSIS — Z789 Other specified health status: Secondary | ICD-10-CM | POA: Diagnosis not present

## 2018-03-28 DIAGNOSIS — Z3480 Encounter for supervision of other normal pregnancy, unspecified trimester: Secondary | ICD-10-CM

## 2018-03-28 NOTE — Progress Notes (Signed)
    Subjective:  Loretta [name] is a 32 y.o. G3P2002 at 5712w3d being seen today for ongoing prenatal care.  She is currently monitored for the following issues for this low-risk pregnancy and has Supervision of other normal pregnancy, antepartum; Gastroesophageal reflux disease; Unwanted fertility; and Language barrier on their problem list.  Patient reports no complaints.  Contractions: Not present. Vag. Bleeding: None.  Movement: Present. Denies leaking of fluid.  Is fasting today for glucola.  The following portions of the patient's history were reviewed and updated as appropriate: allergies, current medications, past family history, past medical history, past social history, past surgical history and problem list. Problem list updated.  Objective:   Vitals:   03/28/18 0824  BP: 106/60  Pulse: 85  Weight: 142 lb 1.6 oz (64.5 kg)    Fetal Status: Fetal Heart Rate (bpm): 144 Fundal Height: 28 cm Movement: Present     General:  Alert, oriented and cooperative. Patient is in no acute distress.  Skin: Skin is warm and dry. No rash noted.   Cardiovascular: Normal heart rate noted  Respiratory: Normal respiratory effort, no problems with respiration noted  Abdomen: Soft, gravid, appropriate for gestational age. Pain/Pressure: Absent     Pelvic:  Cervical exam deferred        Extremities: Normal range of motion.  Edema: None  Mental Status: Normal mood and affect. Normal behavior. Normal judgment and thought content.     Assessment and Plan:  Pregnancy: G3P2002 at 2412w3d  1. Supervision of other normal pregnancy, antepartum  Baby moving daily - Tdap vaccine greater than or equal to 7yo IM - 28 week labs done today with glucola - papers signed for BTL  2. Language barrier Video interpreter used for entire visit  3. Gastroesophageal reflux disease, esophagitis presence not specified Continues to take medication and it is helping  Preterm labor symptoms and  general obstetric precautions including but not limited to vaginal bleeding, contractions, leaking of fluid and fetal movement were reviewed in detail with the patient. Please refer to After Visit Summary for other counseling recommendations.  Return in about 2 weeks (around 04/11/2018).  Nolene BernheimERRI BURLESON, RN, MSN, NP-BC Nurse Practitioner, [facility], [facility] 03/28/2018 8:48 AM

## 2018-03-28 NOTE — Patient Instructions (Signed)
Ligadura de trompas posparto (Postpartum Tubal Ligation) La ligadura de trompas posparto (LTPP) es un procedimiento para cerrar las trompas de Falopio. Esto se realiza con el fin de evitar el embarazo. Cuando las trompas de Falopio se cierran, los vulos que liberan los ovarios no pueden ingresar al tero, y los espermatozoides no pueden llegar a los vulos. La LTPP se realiza de inmediato despus del nacimiento, o 1o 2das despus del nacimiento, antes de que el tero regrese a su posicin normal. La LTPP a veces se conoce como "atadura de trompas". No debe realizarse este procedimiento si quiere quedar embarazada alguna vez o si no est segura de si desea tener ms hijos. INFORME A SU MDICO:  Cualquier alergia que tenga.  Todos los medicamentos que utiliza, incluidos vitaminas, hierbas, gotas oftlmicas, cremas y medicamentos de venta libre.  Problemas previos que usted o los miembros de su familia hayan tenido con el uso de anestsicos.  Enfermedades de la sangre que tenga.  Si tiene cirugas previas.  Si tiene alguna enfermedad.  Cualquier embarazo anterior. RIESGOS Y COMPLICACIONES En general, se trata de un procedimiento seguro. Sin embargo, pueden ocurrir complicaciones, por ejemplo:  Infeccin.  Hemorragia.  Lesin en los rganos circundantes.  Efectos secundarios de los anestsicos.  Fallas en el procedimiento. Este procedimiento puede aumentar el riesgo de una clase de embarazo en el que el vulo fertilizado se adhiere a la parte externa del tero (embarazo ectpico). ANTES DEL PROCEDIMIENTO  Consulte al mdico acerca de lo siguiente: ? El grado de dolor que probablemente sienta. ? Los medicamentos que recibir para el dolor, especialmente si planifica amamantar.  Siga las indicaciones del mdico respecto de las restricciones para las comidas y las bebidas. PROCEDIMIENTO Si tuvo un parto vaginal:  Pueden administrarle uno o ms de los siguientes  medicamentos: ? Un medicamento para ayudarla a relajarse (sedante). ? Un medicamento para adormecer la zona (anestesia local). ? Un medicamento que la har dormir (anestesia general). ? Un medicamento que se inyecta en una zona del cuerpo para adormecer toda la regin que se encuentra por debajo del lugar de la inyeccin (anestesia regional).  Si le administran anestesia general, le introducirn un tubo en la garganta para ayudarla a respirar.  Le introducirn una va intravenosa (IV) en una de las venas para administrarle lquidos y medicamentos durante el procedimiento.  Es posible que le vacen la vejiga con una pequea sonda (catter).  Le harn una incisin justo por debajo del ombligo.  A travs de la incisin, las trompas de Falopio se ubicarn y se subirn.  Las trompas se atarn o quemarn (cauterizarn), o se obstruirn con un clip, un anillo o una abrazadera. Es posible que se extraiga una parte pequea en el centro de cada trompa de Falopio.  La incisin se cerrar con puntos (suturas).  Se colocar una venda (vendaje) sobre la incisin. Si tuvo un parto por cesrea:  La ligadura de trompas se har a travs de la misma incisin del parto por cesrea.  La incisin se cerrar con suturas.  Se colocar una venda sobre la incisin. Este procedimiento puede variar segn el mdico y el hospital. DESPUS DEL PROCEDIMIENTO  Le controlarn con frecuencia la presin arterial, la frecuencia cardaca, la frecuencia respiratoria y el nivel de oxgeno en la sangre hasta que haya desaparecido el efecto de los medicamentos administrados.  Le darn medicamentos para el dolor si los necesita.  No conduzca durante 24horas si le administraron un sedante. Esta informacin no tiene como   fin reemplazar el consejo del mdico. Asegrese de hacerle al mdico cualquier pregunta que tenga. Document Released: 07/19/2005 Document Revised: 01/31/2016 Document Reviewed: 09/19/2015 Elsevier  Interactive Patient Education  2018 Elsevier Inc.  

## 2018-03-29 ENCOUNTER — Encounter: Payer: Self-pay | Admitting: *Deleted

## 2018-03-29 LAB — GLUCOSE TOLERANCE, 2 HOURS W/ 1HR
Glucose, 1 hour: 129 mg/dL (ref 65–179)
Glucose, 2 hour: 110 mg/dL (ref 65–152)
Glucose, Fasting: 75 mg/dL (ref 65–91)

## 2018-03-29 LAB — CBC
Hematocrit: 29.9 % — ABNORMAL LOW (ref 34.0–46.6)
Hemoglobin: 9.8 g/dL — ABNORMAL LOW (ref 11.1–15.9)
MCH: 29 pg (ref 26.6–33.0)
MCHC: 32.8 g/dL (ref 31.5–35.7)
MCV: 89 fL (ref 79–97)
PLATELETS: 386 10*3/uL (ref 150–450)
RBC: 3.38 x10E6/uL — AB (ref 3.77–5.28)
RDW: 13.3 % (ref 12.3–15.4)
WBC: 7.7 10*3/uL (ref 3.4–10.8)

## 2018-03-29 LAB — RPR: RPR Ser Ql: NONREACTIVE

## 2018-03-29 LAB — HIV ANTIBODY (ROUTINE TESTING W REFLEX): HIV SCREEN 4TH GENERATION: NONREACTIVE

## 2018-04-12 ENCOUNTER — Encounter: Payer: Medicaid Other | Admitting: Medical

## 2018-05-16 ENCOUNTER — Ambulatory Visit (INDEPENDENT_AMBULATORY_CARE_PROVIDER_SITE_OTHER): Payer: Medicaid Other | Admitting: Student

## 2018-05-16 DIAGNOSIS — O99013 Anemia complicating pregnancy, third trimester: Secondary | ICD-10-CM

## 2018-05-16 DIAGNOSIS — Z3480 Encounter for supervision of other normal pregnancy, unspecified trimester: Secondary | ICD-10-CM

## 2018-05-16 MED ORDER — FERROUS FUMARATE 325 (106 FE) MG PO TABS: 1.0000 | ORAL_TABLET | Freq: Two times a day (BID) | ORAL | 0 refills | Status: DC

## 2018-05-16 NOTE — Progress Notes (Signed)
   Subjective:    Patient ID: Loretta [name], female    DOB: 04-Jan-1986, 32 y.o.   MRN: [id number]    PRENATAL VISIT NOTE  Subjective:  Loretta [name] is a 32 y.o. G3P2002 at 6373w3d being seen today for ongoing prenatal care.  She is currently monitored for the following issues for this low-risk pregnancy and has Supervision of other normal pregnancy, antepartum; Gastroesophageal reflux disease; Unwanted fertility; Language barrier; and Anemia affecting pregnancy in third trimester on their problem list.  Patient reports difficulty breathing. it is worse when she walks. .  Contractions: Not present. Vag. Bleeding: None.  Movement: Present. Denies leaking of fluid.   The following portions of the patient's history were reviewed and updated as appropriate: allergies, current medications, past family history, past medical history, past social history, past surgical history and problem list. Problem list updated.  Objective:   Vitals:   05/16/18 0840  BP: 112/70  Pulse: (!) 118  Weight: 144 lb 6.4 oz (65.5 kg)    Fetal Status: Fetal Heart Rate (bpm): 142 Fundal Height: 35 cm Movement: Present     General:  Alert, oriented and cooperative. Patient is in no acute distress.  Skin: Skin is warm and dry. No rash noted.   Cardiovascular: Normal heart rate noted  Respiratory: Normal respiratory effort, no problems with respiration noted  Abdomen: Soft, gravid, appropriate for gestational age.  Pain/Pressure: Present     Pelvic: Cervical exam deferred        Extremities: Normal range of motion.  Edema: None  Mental Status: Normal mood and affect. Normal behavior. Normal judgment and thought content.   Assessment and Plan:  Pregnancy: G3P2002 at 1873w3d  1. Supervision of other normal pregnancy, antepartum -cultures and check presentation next visit -patient has chosen pediatrician [facility]) 2. Anemia affecting pregnancy in third trimester  -RX for anemia    Preterm labor symptoms and general obstetric precautions including but not limited to vaginal bleeding, contractions, leaking of fluid and fetal movement were reviewed in detail with the patient. Please refer to After Visit Summary for other counseling recommendations.  Return in about 1 week (around 05/23/2018), or 1 week.  No future appointments.  Loretta [name], CNM

## 2018-05-16 NOTE — Progress Notes (Signed)
Pt feels that sometimes she has problems breathing.

## 2018-05-16 NOTE — Patient Instructions (Signed)
-tomar las [employer] al dia, con un jugo (de [place], etc). No los consume con productos de [place].  Levonorgestrel intrauterine device (IUD) Qu es este medicamento? El DIU CON LEVONORGESTREL es un dispositivo anticonceptivo (control de la natalidad). Un profesional de Engineer, drilling dispositivo dentro el tero. Se [place] para evitar los embarazos. Este dispositivo tambin puede usarse para tratar el sangrado intenso que se produce durante el perodo menstrual. Este medicamento puede ser utilizado para otros usos; si tiene alguna pregunta consulte con su proveedor de atencin mdica o con su farmacutico. MARCAS COMUNES: Rutha Bouchard, [place], [place], Skyla Qu le debo informar a mi profesional de la salud antes de tomar este medicamento? Necesitan saber si usted presenta alguno de los [employer] o situaciones: examen de Papanicolaou anormal cncer de mama, tero o crvix diabetes endometritis infeccin genital o plvica actual o en el pasado tener ms de una pareja sexual o si su pareja tiene ms de una pareja enfermedad cardiaca antecedentes de Psychiatrist ectpico o tubrico problemas del sistema inmunolgico DIU colocado en su lugar enfermedad o tumor heptico problemas con cogulos sanguneos o tomar anticoagulantes convulsiones uso de drogas intravenosas tero con forma inusual sangrado vaginal que no ha sido explicado una reaccin alrgica o inusual al levonorgestrel, a otras hormonas, a la silicona, o al polietileno, a otros medicamentos, alimentos, colorantes o conservantes si est embarazada o buscando quedar embarazada si est amamantando a un beb Cmo debo [employer]? Un profesional de Naval architect este dispositivo en el tero. Hable con su pediatra para informarse acerca del uso de este medicamento en nios. Puede requerir atencin especial. Sobredosis: Pngase en contacto inmediatamente con un centro toxicolgico o una sala de urgencia si usted cree que  haya tomado demasiado medicamento. ATENCIN: [employer] es solo para usted. No comparta este medicamento con nadie. Qu sucede si me olvido de una dosis? No se aplica en este caso. Segn la marca del dispositivo que tenga insertado, el dispositivo deber reemplazarse cada 3 a 5 aos si desea continuar usando este tipo de mtodo anticonceptivo. Qu puede interactuar con este medicamento? No tome este medicamento con ninguno de los siguientes frmacos: amprenavir bosentano fosamprenavir Este medicamento tambin puede interactuar con los siguientes medicamentos: aprepitant armodafinilo barbitricos para inducir el sueo o para el tratamiento de convulsiones bexaroteno boceprevir griseofulvina medicamentos para tratar convulsiones, tales como carbamazepina, etotona, [place], [place], [place], topiramato modafinilo pioglitazona rifabutina rifampicina rifapentina algunos medicamentos para tratar la infeccin por el VIH, tales como atazanavir, efavirenz, indinavir, lopinavir, nelfinavir, tipranavir, ritonavir hierba de [place] warfarina Puede ser que esta lista no menciona todas las posibles interacciones. Informe a su profesional de [employer] de [employer] productos a base de hierbas, medicamentos de [place] o suplementos nutritivos que est tomando. Si usted fuma, consume bebidas alcohlicas o si utiliza drogas ilegales, indqueselo tambin a su profesional de [employer]. Algunas sustancias pueden interactuar con su medicamento. A qu debo estar atento al usar [employer] Corporation? Visite a su mdico o a su profesional de la salud para revisar su evolucin peridicamente. Consulte a su mdico si usted o su pareja tiene contacto sexual con Economist, descubre que tiene VIH positivo, o contrae una enfermedad de transmisin sexual. Cleda Clarks producto no la protege de la infeccin por VIH (SIDA) ni de ninguna otra enfermedad de transmisin sexual. Puede verificar la colocacin del DIU usted  misma colocando los dedos limpios en la parte superior de la vagina para tocar los hilos. No tire  de los hilos. Es un buen hbito verificar la colocacin del DIU despus de cada perodo menstrual. Llame a su mdico de inmediato si siente ms del DIU que solo los hilos o si no puede sentir los hilos. El DIU podra salirse solo. Es posible que quede embarazada si el dispositivo se sale. Si nota que se ha [employer] use un mtodo anticonceptivo de respaldo, [place] condones, y llame a su proveedor de Psychologist, prison and probation services. Usar tampones no cambiar la posicin del DIU y puede usarlos sin problema durante sus perodos menstruales. Este DIU puede escanearse en forma segura en una resonancia magntica (MRI) solo bajo condiciones especficas. Antes de realizarse [employer], informe a su proveedor de atencin mdica que tiene colocado un DIU, y el tipo de DIU que tiene. Qu efectos secundarios puedo tener al [employer] este medicamento? Efectos secundarios que debe informar a su mdico o a Producer, television/film/video de la salud tan pronto como sea posible: -Therapist, art como erupcin cutnea, picazn o urticarias, hinchazn de la cara, labios o lengua -fiebre, sntomas gripales -llagas genitales -alta presin sangunea -ausencia de un perodo menstrual durante 6 semanas mientras lo utiliza -Engineer, mining, Public librarian en las piernas -dolor o sensibilidad del plvico -dolor de cabeza repentino o severo -signos de Psychiatrist -calambres estomacales -falta de aliento repentina -problemas de coordinacin, del habla, al caminar -sangrado, flujo vaginal inusual -color amarillento de los ojos o la piel Efectos secundarios que, por lo general, no requieren atencin mdica (debe informarlos a su mdico o a su profesional de la salud si persisten o si son molestos): -acn -dolor de pecho -cambios en el deseo sexual o capacidad -cambios de peso -calambres, Research scientist (life sciences) o sensacin de [employer] se introduce el  dispositivo -dolor de cabeza -sangrado menstruales irregulares en los primeros 3 a 6 meses de usar -nuseas Puede ser que esta lista no menciona todos los posibles efectos secundarios. Comunquese a su mdico por asesoramiento mdico [employer]lett[employer]ard. Usted puede informar los efectos secundarios a la FDA por telfono al 1-800-FDA-1088. Dnde debo guardar mi medicina? No se aplica en este caso. ATENCIN: Este folleto es un resumen. Puede ser que no cubra toda la posible informacin. Si usted tiene preguntas acerca de esta medicina, consulte con su mdico, su farmacutico o su profesional de Radiographer, therapeutic.  2018 Elsevier/Gold Standard (2016-11-09 00:00:00)   Anemia Anemia La anemia es una afeccin en la cual no hay suficientes glbulos rojos o hemoglobina. La hemoglobina es la sustancia de los glbulos rojos que lleva el oxgeno. Cuando no hay suficientes glbulos rojos o hemoglobina (est anmico), su cuerpo no puede recibir el oxgeno suficiente, y es posible que sus rganos no funcionen correctamente. Como [place], es posible que se sienta muy cansado o sufra otros problemas. Cules son las causas? Las causas ms frecuentes de anemia son:  Sharlyne Pacas. La anemia puede ser causada por un sangrado excesivo dentro o fuera del cuerpo, incluido el sangrado del intestino o del perodo en las mujeres.  Dficit nutricional.  Enfermedad heptica, tiroidea o renal (crnicas).  Trastornos de la mdula sea.  Cncer y tratamientos para Management consultant.  VIH (virus de inmunodeficiencia [place]) y SIDA (sndrome de inmunodeficiencia adquirida).  Tratamientos para el VIH y [place].  Problemas en el bazo.  Enfermedades de [employer].  Infecciones, medicamentos y enfermedades autoinmunes que [employer] glbulos rojos.  Cules son los signos o los sntomas? Los sntomas de esta afeccin incluyen los siguientes:  Debilidad leve.  Mareos.  Dolor de Turkmenistancabeza.  Sensacin de  latidos cardacos irregulares o ms rpidos que lo normal (palpitaciones).  Falta de aire, especialmente con el ejercicio.  Palidez.  Sensibilidad al fro.  Dispepsia.  Nuseas.  Dificultad para dormir.  Dificultad para concentrarse.  Los sntomas pueden ocurrir repentinamente o Audiological scientistmanifestarse lentamente. Si la anemia es leve, es posible que no tenga sntomas. Cmo se diagnostica? Esta afeccin se diagnostica en funcin de lo siguiente:  Anlisis de sangre.  Sus antecedentes mdicos.  Un examen fsico.  Biopsia de mdula sea.  Adems, el mdico puede controlar si hay sangre en sus heces (materia fecal) y Education officer, environmentalrealizar anlisis adicionales para Engineer, manufacturingdetectar la causa del sangrado. Tambin pueden hacerle otros estudios, por ejemplo:  Pruebas de diagnstico por imgenes, como una resonancia magntica (RM) o una exploracin por tomografa computarizada (TC).  Endoscopia.  Colonoscopia.  Cmo se trata? El tratamiento de esta afeccin depende de la causa. Si contina perdiendo The Progressive Corporationmucha sangre, es posible que necesite recibir tratamiento en un hospital. El tratamiento puede incluir lo siguiente:  Tomar suplementos de hierro, vitamina B12 o cido flico.  Tomar un medicamento para las hormonas (eritropoyetina) que puede ayudar a Radio producerestimular el desarrollo de glbulos rojos.  Recibir una transfusin de Tontoganysangre. Esta ser necesaria si pierde The Progressive Corporationmucha sangre.  Realizar cambios en la dieta.  Someterse a [place]una ciruga para Public house managerextirpar el bazo.  Siga estas indicaciones en su casa:  Tome los medicamentos de venta libre y los recetados solamente como se lo haya indicado el mdico.  Tome los suplementos solamente como se lo haya indicado el mdico.  Siga las instrucciones de la dieta que le hayan dado.  Concurra a todas las visitas de [address] Parkwayseguimiento como se lo haya indicado el mdico. Esto es importante. Comunquese con un mdico si:  Tiene nuevos sangrados en cualquier parte del cuerpo. Solicite  ayuda de inmediato si:  Se siente muy dbil.  Le falta el aire.  Siente dolor en la espalda, el abdomen o el pecho.  Se siente mareado o sufre un desmayo.  Tiene dificultad para concentrarse.  Las heces son alquitranadas, sanguinolentas o negras.  Vomita repetidamente o vomita sangre. Resumen  La anemia es una afeccin en la que no hay suficientes glbulos rojos o la cantidad suficiente de la sustancia de los glbulos rojos que transporta el oxgeno (hemoglobina).  Los sntomas pueden ocurrir repentinamente o Audiological scientistmanifestarse lentamente.  Si la anemia es leve, es posible que no tenga sntomas.  Esta afeccin se diagnostica mediante anlisis de sangre y un examen fsico, y en funcin de sus antecedentes mdicos. Pueden ser necesarios otros estudios.  El tratamiento de esta afeccin depende de la causa de la anemia. Esta informacin no tiene Theme park managercomo fin reemplazar el consejo del mdico. Asegrese de hacerle al mdico cualquier pregunta que tenga. Document Released: 10/09/2005 Document Revised: 01/29/2017 Document Reviewed: 01/29/2017 Elsevier Interactive Patient Education  2018 ArvinMeritorElsevier Inc.

## 2018-05-20 ENCOUNTER — Encounter: Payer: Self-pay | Admitting: Student

## 2018-05-27 ENCOUNTER — Other Ambulatory Visit (HOSPITAL_COMMUNITY)
Admission: RE | Admit: 2018-05-27 | Discharge: 2018-05-27 | Disposition: A | Discharge status: Home / Self Care | Payer: Medicaid Other | Source: Ambulatory Visit | Attending: Family Medicine | Admitting: Family Medicine

## 2018-05-27 ENCOUNTER — Ambulatory Visit (INDEPENDENT_AMBULATORY_CARE_PROVIDER_SITE_OTHER): Payer: Medicaid Other | Admitting: Family Medicine

## 2018-05-27 VITALS — BP 118/85 | HR 104 | Wt 146.6 lb

## 2018-05-27 DIAGNOSIS — Z3480 Encounter for supervision of other normal pregnancy, unspecified trimester: Secondary | ICD-10-CM | POA: Diagnosis not present

## 2018-05-27 DIAGNOSIS — Z789 Other specified health status: Secondary | ICD-10-CM

## 2018-05-27 DIAGNOSIS — D649 Anemia, unspecified: Secondary | ICD-10-CM

## 2018-05-27 DIAGNOSIS — Z3483 Encounter for supervision of other normal pregnancy, third trimester: Secondary | ICD-10-CM | POA: Insufficient documentation

## 2018-05-27 DIAGNOSIS — Z3A37 37 weeks gestation of pregnancy: Secondary | ICD-10-CM | POA: Insufficient documentation

## 2018-05-27 DIAGNOSIS — K219 Gastro-esophageal reflux disease without esophagitis: Secondary | ICD-10-CM

## 2018-05-27 DIAGNOSIS — O99013 Anemia complicating pregnancy, third trimester: Secondary | ICD-10-CM

## 2018-05-27 NOTE — Progress Notes (Signed)
   PRENATAL VISIT NOTE  Subjective:  Loretta [name] is a 32 y.o. G3P2002 at 534w0d being seen today for ongoing prenatal care.  She is currently monitored for the following issues for this low-risk pregnancy and has Supervision of other normal pregnancy, antepartum; Gastroesophageal reflux disease; Unwanted fertility; Language barrier; and Anemia affecting pregnancy in third trimester on their problem list.  Patient reports no complaints.  Contractions: Not present. Vag. Bleeding: None.  Movement: Present. Denies leaking of fluid.   The following portions of the patient's history were reviewed and updated as appropriate: allergies, current medications, past family history, past medical history, past social history, past surgical history and problem list. Problem list updated.  Objective:   Vitals:   05/27/18 1421  BP: 118/85  Pulse: (!) 104  Weight: 146 lb 9.6 oz (66.5 kg)    Fetal Status: Fetal Heart Rate (bpm): 136 Fundal Height: 36 cm Movement: Present     General:  Alert, oriented and cooperative. Patient is in no acute distress.  Skin: Skin is warm and dry. No rash noted.   Cardiovascular: Normal heart rate noted  Respiratory: Normal respiratory effort, no problems with respiration noted  Abdomen: Soft, gravid, appropriate for gestational age.  Pain/Pressure: Present     Pelvic: Cervical exam deferred        Extremities: Normal range of motion.  Edema: None  Mental Status: Normal mood and affect. Normal behavior. Normal judgment and thought content.   Assessment and Plan:  Pregnancy: G3P2002 at 1734w0d  1. Supervision of other normal pregnancy, antepartum FHT and FH normal. GBS, GC/CT today - [facility]Chlamydia [facility] amp [facility] - [facility], beta strep (group b only)  2. Language barrier Interpreter used - [facility]lamydia probe [facility] - Culture, beta strep (group b only)  3. Gastroesophageal reflux disease, esophagitis presence  not specified On PPI  4. Anemia affecting pregnancy in third trimester Taking iron  Preterm labor symptoms and general obstetric precautions including but not limited to vaginal bleeding, contractions, leaking of fluid and fetal movement were reviewed in detail with the patient. Please refer to After Visit Summary for other counseling recommendations.  Return in about 1 week (around 06/03/2018) for [facility] OB f/u.  No future appointments.  Levie HeritageJacob J Koralee Wedeking, DO

## 2018-05-28 LAB — GC/CHLAMYDIA PROBE AMP (~~LOC~~) NOT AT ARMC
CHLAMYDIA, DNA PROBE: NEGATIVE
NEISSERIA GONORRHEA: NEGATIVE

## 2018-05-31 LAB — CULTURE, BETA STREP (GROUP B ONLY): Strep Gp B Culture: NEGATIVE

## 2018-06-07 ENCOUNTER — Encounter: Payer: Self-pay | Admitting: Certified Nurse Midwife

## 2018-06-07 ENCOUNTER — Ambulatory Visit (INDEPENDENT_AMBULATORY_CARE_PROVIDER_SITE_OTHER): Payer: Medicaid Other | Admitting: Certified Nurse Midwife

## 2018-06-07 VITALS — BP 115/71 | HR 84 | Wt 147.3 lb

## 2018-06-07 DIAGNOSIS — Z3480 Encounter for supervision of other normal pregnancy, unspecified trimester: Secondary | ICD-10-CM

## 2018-06-07 DIAGNOSIS — Z789 Other specified health status: Secondary | ICD-10-CM

## 2018-06-07 DIAGNOSIS — O99013 Anemia complicating pregnancy, third trimester: Secondary | ICD-10-CM

## 2018-06-07 NOTE — Patient Instructions (Addendum)
Preparation H  Razones para volver a MAU:  1. Las contracciones son cada 5 minutos o menos, cada uno de los ltimos 1 minuto, stos han llevado a cabo durante 1-2 horas, y no se puede caminar o hablar durante ellas 2. Usted tiene un gran chorro de lquido o un goteo de lquido que no se detiene y hay que usar una toalla 3. Ha de sangrado que es de color rojo brillante, denso que el manchado - como sangrado menstrual (manchado puede ser normal en trabajo de parto prematuro o despus de una comprobacin de su cuello uterino) 4. Usted no se siente el beb se mueve como l / ella hace normalmente   Informacin sobre el dispositivo intrauterino (Intrauterine Device Information) Un dispositivo intrauterino (DIU) se inserta en el tero e impide el embarazo. Hay dos tipos de DIU:  DIU de cobre: este tipo de DIU est recubierto con un alambre de cobre y se inserta dentro del tero. El cobre hace que el tero y las trompas de Falopio produzcan un liquido que [employer] espermatozoides. El DIU de cobre puede Geneticist, molecular durante 10 aos.  DIU con hormona: este tipo de DIU contiene la hormona progestina (progesterona sinttica). Las hormonas hacen que el moco cervical se haga ms espeso, lo que evita que el esperma ingrese al tero. Tambin hace que la membrana que recubre internamente al tero sea ms delgada lo que impide el implante del vulo fertilizado. La hormona debilita o destruye los espermatozoides que ingresan al tero. Alguno de los tipos de DIU hormonal pueden Geneticist, molecular durante 5 aos y otros tipos pueden dejarse en el lugar por 3 aos. El mdico se asegurar de que usted sea una buena candidata para usar el DIU. Converse con su mdico acerca de los posibles efectos secundarios. VENTAJASDEL DISPOSITIVO INTRAUTERINO  El DIU es muy eficaz, reversible, de accin prolongada y de bajo mantenimiento.  No hay efectos secundarios relacionados con el estrgeno.  El DIU puede  ser utilizado durante la Market researcher.  No est asociado con el aumento de [place].  Funciona inmediatamente despus de la insercin.  El DIU hormonal funciona inmediatamente si se inserta dentro de los [address] del inicio del perodo. Ser necesario que utilice un mtodo anticonceptivo adicional durante 7 das si el DIU hormonal se inserta en algn otro momento del ciclo.  El DIU de cobre no interfiere con las hormonas femeninas.  El DIU hormonal puede hacer que los perodos menstruales abundantes se hagan ms ligeros y que haya menos clicos.  El DIU hormonal puede usarse durante 3 a 5 aos.  El DIU de cobre puede usarse durante 10 aos.  DESVENTAJASDEL DISPOSITIVO [employer] DIU hormonal puede estar asociado con patrones de sangrado irregular.  El DIU de cobre puede hacer que el flujo menstrual ms abundante y doloroso.  Puede experimentar clicos y sangrado vaginal despus de la insercin.  Esta informacin no tiene Theme park manager el consejo del mdico. Asegrese de hacerle al mdico cualquier pregunta que tenga. Document Released: 03/29/2010 Document Revised: 01/31/2016 Document Reviewed: 03/30/2013 Elsevier Interactive Patient Education  2017 [employer].    Etonogestrel implant (Nexplanon) Qu es este medicamento? El ETONOGESTREL es un dispositivo anticonceptivo (control de la natalidad). Se [place] para evitar los embarazos. Se puede usar hasta por 3 aos. Este medicamento puede ser utilizado para otros usos; si tiene alguna pregunta consulte con su proveedor de atencin mdica o con su farmacutico. MARCAS COMUNES: Implanon, Nexplanon Qu le debo  informar a mi profesional de la salud antes de tomar este medicamento? Necesita saber si usted presenta alguno de los siguientes problemas o situaciones: sangrado vaginal anormal enfermedad vascular o cogulos sanguneos cncer de mama, cervical, heptico depresin diabetes enfermedad de la vescula biliar dolores de cabeza  enfermedad cardiaca o ataque cardiaco reciente alta presin sangunea alto nivel de colesterol enfermedad renal enfermedad heptica convulsiones fuma tabaco una reaccin alrgica o inusual al etonogestrel, otras hormonas, anestsicos o antispticos, medicamentos, alimentos, colorantes o conservantes si est embarazada o buscando quedar embarazada si est amamantando a un beb Cmo debo SLM Corporationutilizar este medicamento? Un profesional de la salud inserta este dispositivo justo debajo de la piel en la parte interior del brazo. Hable con su pediatra para informarse acerca del uso de este medicamento en nios. Puede requerir atencin especial. Sobredosis: Pngase en contacto inmediatamente con un centro toxicolgico o una sala de urgencia si usted cree que haya tomado demasiado medicamento. ATENCIN: Reynolds AmericanEste medicamento es solo para usted. No comparta este medicamento con nadie. Qu sucede si me olvido de una dosis? No se aplica en este caso. Qu puede interactuar con este medicamento? No tome este medicamento con ninguno de los siguientes frmacos: amprenavir bosentano fosamprenavir Este medicamento tambin podra interactuar con los siguientes medicamentos: barbitricos para inducir el sueo o para el tratamiento de convulsiones ciertos medicamentos para infecciones micticas, tales como itraconazol y Associate Professorketoconazol jugo de toronja griseofulvina medicamentos para tratar convulsiones, tales como Oneontacarbamazepina, Redding Centerfelbamato, Winnoxcarbazepina, Orange Cityfenitona, topiramato modafinilo fenilbutazona rifampicina rufinamida algunos medicamentos para tratar la infeccin por el VIH, tales como atazanavir, indinavir, lopinavir, nelfinavir, tipranavir, ritonavir hierba de [address]San Juan Puede ser que esta lista no menciona todas las posibles interacciones. Informe a su profesional de Beazer Homesla salud de [employer] Inctodos los productos a base de hierbas, medicamentos de Vallejoventa libre o suplementos nutritivos que est tomando. Si usted fuma, consume bebidas alcohlicas  o si utiliza drogas ilegales, indqueselo tambin a su profesional de Beazer Homesla salud. Algunas sustancias pueden interactuar con su medicamento. A qu debo estar atento al usar PPL Corporationeste medicamento? Este producto no protege contra la infeccin por el VIH (SIDA) u otras enfermedades de transmisin sexual. Usted debe sentir el implante al presionar con las yemas de los dedos sobre la piel donde se insert. Contacte a su mdico si no se siente el implante y [place] un mtodo anticonceptivo no hormonal (como el condn) hasta que el mdico confirma que el implante est en su Environmental consultantlugar. Si siente que el implante puede haber roto o doblado en su brazo, pngase en contacto con su proveedor de atencin mdica. Qu efectos secundarios puedo tener al Boston Scientificutilizar este medicamento? Efectos secundarios que debe informar a su mdico o a Producer, television/film/videosu profesional de la salud tan pronto como sea posible: Therapist, artreacciones alrgicas, como erupcin cutnea, picazn o urticarias, e hinchazn de la cara, los labios o la lengua bultos en las mamas cambios en las emociones o el estado de nimo estado de nimo deprimido sangrado menstrual intenso o Software engineerprolongado dolor, irritacin, hinchazn o Counsellormoretones en el lugar de la insercin Immunologistcicatriz en el lugar de la insercin signos de infeccin en el sitio de insercin tales como fiebre, y enrojecimiento de la piel, Engineer, miningdolor o secrecin signos de Psychiatristembarazo signos y sntomas de un cogulo sanguneo, tales como problemas respiratorios; cambios en la visin; dolor en el pecho; dolor de cabeza severo, repentino; dolor, hinchazn, calor en la pierna; dificultad para hablar; entumecimiento o debilidad repentina de la cara, el brazo o la pierna signos y sntomas de lesin al  hgado, como orina amarilla oscura o [place]marrn; sensacin general de estar enfermo o sntomas gripales; heces claras; prdida de apetito; nuseas; dolor en la regin abdominal superior derecha; cansancio o debilidad inusual; color amarillento de los ojos o la piel sangrado  vaginal inusual, secrecin signos y sntomas de un derrame cerebral, tales como cambios en la visin; confusin; dificultad para hablar o entender; dolores de cabeza severos; entumecimiento o debilidad repentina de la cara, el brazo o la pierna; problemas al caminar; Psychiatristmareo; prdida del equilibrio o la coordinacin Efectos secundarios que generalmente no requieren Psychologist, prison and probation servicesatencin mdica (infrmelos a su mdico o a Producer, television/film/videosu profesional de la salud si persisten o si son molestos): acn dolor de Retail buyerespalda dolor en las mamas cambios de peso mareos sensacin general de estar enfermo o sntomas gripales dolor de cabeza sangrado menstrual irregular nuseas dolor de garganta irritacin o inflamacin vaginal Puede ser que esta lista no menciona todos los posibles efectos secundarios. Comunquese a su mdico por asesoramiento mdico Hewlett-Packardsobre los efectos secundarios. Usted puede informar los efectos secundarios a la FDA por telfono al 1-800-FDA-1088. Dnde debo guardar mi medicina? Este medicamento se administra en hospitales o clnicas y no necesitar guardarlo en su domicilio. ATENCIN: Este folleto es un resumen. Puede ser que no cubra toda la posible informacin. Si usted tiene preguntas acerca de esta medicina, consulte con su mdico, su farmacutico o su profesional de Radiographer, therapeuticla salud.  2018 Elsevier/Gold Standard (2016-11-09 00:00:00)

## 2018-06-11 NOTE — Progress Notes (Signed)
   PRENATAL VISIT NOTE  Subjective:  Loretta [name] is a 32 y.o. G3P2002 at 64101w4d being seen today for ongoing prenatal care.  She is currently monitored for the following issues for this low-risk pregnancy and has Supervision of other normal pregnancy, antepartum; Gastroesophageal reflux disease; Unwanted fertility; Language barrier; and Anemia affecting pregnancy in third trimester on their problem list.  Patient reports no complaints.  Contractions: Not present. Vag. Bleeding: None.  Movement: Present. Denies leaking of fluid.   The following portions of the patient's history were reviewed and updated as appropriate: allergies, current medications, past family history, past medical history, past social history, past surgical history and problem list. Problem list updated.  Objective:   Vitals:   06/07/18 1534  BP: 115/71  Pulse: 84  Weight: 147 lb 4.8 oz (66.8 kg)    Fetal Status: Fetal Heart Rate (bpm): 150 Fundal Height: 36 cm Movement: Present     General:  Alert, oriented and cooperative. Patient is in no acute distress.  Skin: Skin is warm and dry. No rash noted.   Cardiovascular: Normal heart rate noted  Respiratory: Normal respiratory effort, no problems with respiration noted  Abdomen: Soft, gravid, appropriate for gestational age.  Pain/Pressure: Present     Pelvic: Cervical exam deferred        Extremities: Normal range of motion.  Edema: None  Mental Status: Normal mood and affect. Normal behavior. Normal judgment and thought content.   Assessment and Plan:  Pregnancy: G3P2002 at 4027w1d  1. Supervision of other normal pregnancy, antepartum -Patient doing well, no complaints  -Lab results reviewed with patient with negative GBS  -Patient is unsure that she wants tubal but wants LARC, educated and discussed Nexplanon vs IUD, information placed in AVS to discuss with husband  -Anticipatory guidance on upcoming appointment and if patient goes past 40  weeks   2. Language barrier -Interpreter at bedside   3. Anemia affecting pregnancy in third trimester -Patient is currently taking iron supplementation   Term labor symptoms and general obstetric precautions including but not limited to vaginal bleeding, contractions, leaking of fluid and fetal movement were reviewed in detail with the patient. Please refer to After Visit Summary for other counseling recommendations.  Return in about 1 week (around 06/14/2018) for ROB.  Future Appointments  Date Time Provider Department Center  06/17/2018 10:35 AM Armando ReichertHogan, Heather D, CNM [facility]    Sharyon CableVeronica C Ennifer Harston, CNM

## 2018-06-17 ENCOUNTER — Ambulatory Visit (INDEPENDENT_AMBULATORY_CARE_PROVIDER_SITE_OTHER): Payer: Medicaid Other | Admitting: Advanced Practice Midwife

## 2018-06-17 VITALS — BP 113/75 | HR 90 | Wt 147.0 lb

## 2018-06-17 DIAGNOSIS — Z3480 Encounter for supervision of other normal pregnancy, unspecified trimester: Secondary | ICD-10-CM

## 2018-06-17 NOTE — Progress Notes (Signed)
   PRENATAL VISIT NOTE  Subjective:  Loretta [name] is a 32 y.o. G3P2002 at 3742w0d being seen today for ongoing prenatal care.  She is currently monitored for the following issues for this low-risk pregnancy and has Supervision of other normal pregnancy, antepartum; Gastroesophageal reflux disease; Unwanted fertility; Language barrier; and Anemia affecting pregnancy in third trimester on their problem list.  Patient reports no complaints.  Contractions: Not present. Vag. Bleeding: None.  Movement: Present. Denies leaking of fluid.   The following portions of the patient's history were reviewed and updated as appropriate: allergies, current medications, past family history, past medical history, past social history, past surgical history and problem list. Problem list updated.  Objective:   Vitals:   06/17/18 1055  BP: 113/75  Pulse: 90  Weight: 147 lb (66.7 kg)    Fetal Status: Fetal Heart Rate (bpm): 148 Fundal Height: 38 cm Movement: Present  Presentation: Vertex  General:  Alert, oriented and cooperative. Patient is in no acute distress.  Skin: Skin is warm and dry. No rash noted.   Cardiovascular: Normal heart rate noted  Respiratory: Normal respiratory effort, no problems with respiration noted  Abdomen: Soft, gravid, appropriate for gestational age.  Pain/Pressure: Present     Pelvic: Cervical exam performed Dilation: Fingertip Effacement (%): Thick Station: -3  Extremities: Normal range of motion.  Edema: None  Mental Status: Normal mood and affect. Normal behavior. Normal judgment and thought content.   Assessment and Plan:  Pregnancy: G3P2002 at 6242w0d  1. Supervision of other normal pregnancy, antepartum - BPP/NST on 06/21/18 - IOL scheduled for 06/25/18 at 6:30am, orders placed   Term labor symptoms and general obstetric precautions including but not limited to vaginal bleeding, contractions, leaking of fluid and fetal movement were reviewed in detail with  the patient. Please refer to After Visit Summary for other counseling recommendations.  No follow-ups on file.  Future Appointments  Date Time Provider Department Center  06/25/2018  6:30 AM [facility] None    Thressa ShellerHeather Hogan, CNM

## 2018-06-18 ENCOUNTER — Telehealth (HOSPITAL_COMMUNITY): Payer: Self-pay | Admitting: *Deleted

## 2018-06-18 NOTE — Telephone Encounter (Signed)
Preadmission screen Interpreter number [phone]

## 2018-06-20 ENCOUNTER — Other Ambulatory Visit: Payer: Medicaid Other

## 2018-06-23 ENCOUNTER — Inpatient Hospital Stay (HOSPITAL_COMMUNITY)
Admission: AD | Admit: 2018-06-23 | Discharge: 2018-06-25 | DRG: 807 | Disposition: A | Discharge status: Home / Self Care | Payer: Medicaid Other | Attending: Obstetrics & Gynecology | Admitting: Obstetrics & Gynecology

## 2018-06-23 ENCOUNTER — Inpatient Hospital Stay (HOSPITAL_COMMUNITY): Payer: Medicaid Other | Admitting: Anesthesiology

## 2018-06-23 ENCOUNTER — Other Ambulatory Visit: Payer: Self-pay

## 2018-06-23 ENCOUNTER — Encounter (HOSPITAL_COMMUNITY): Payer: Self-pay

## 2018-06-23 DIAGNOSIS — O480 Post-term pregnancy: Secondary | ICD-10-CM | POA: Diagnosis not present

## 2018-06-23 DIAGNOSIS — K219 Gastro-esophageal reflux disease without esophagitis: Secondary | ICD-10-CM | POA: Diagnosis present

## 2018-06-23 DIAGNOSIS — Z3A40 40 weeks gestation of pregnancy: Secondary | ICD-10-CM

## 2018-06-23 DIAGNOSIS — O9902 Anemia complicating childbirth: Secondary | ICD-10-CM | POA: Diagnosis present

## 2018-06-23 DIAGNOSIS — O9962 Diseases of the digestive system complicating childbirth: Secondary | ICD-10-CM | POA: Diagnosis present

## 2018-06-23 DIAGNOSIS — Z3A37 37 weeks gestation of pregnancy: Secondary | ICD-10-CM | POA: Diagnosis not present

## 2018-06-23 DIAGNOSIS — D649 Anemia, unspecified: Secondary | ICD-10-CM | POA: Diagnosis present

## 2018-06-23 DIAGNOSIS — Z3480 Encounter for supervision of other normal pregnancy, unspecified trimester: Secondary | ICD-10-CM

## 2018-06-23 LAB — CBC
HCT: 32.9 % — ABNORMAL LOW (ref 36.0–46.0)
Hemoglobin: 10.8 g/dL — ABNORMAL LOW (ref 12.0–15.0)
MCH: 27.5 pg (ref 26.0–34.0)
MCHC: 32.8 g/dL (ref 30.0–36.0)
MCV: 83.7 fL (ref 78.0–100.0)
PLATELETS: 343 10*3/uL (ref 150–400)
RBC: 3.93 MIL/uL (ref 3.87–5.11)
RDW: 15.7 % — ABNORMAL HIGH (ref 11.5–15.5)
WBC: 7.9 10*3/uL (ref 4.0–10.5)

## 2018-06-23 LAB — TYPE AND SCREEN
ABO/RH(D): O POS
ANTIBODY SCREEN: NEGATIVE

## 2018-06-23 LAB — RPR: RPR Ser Ql: NONREACTIVE

## 2018-06-23 LAB — ABO/RH: ABO/RH(D): O POS

## 2018-06-23 MED ORDER — DIBUCAINE 1 % RE OINT: 1.0000 "application " | TOPICAL_OINTMENT | RECTAL | Status: DC | PRN

## 2018-06-23 MED ORDER — OXYCODONE HCL 5 MG PO TABS: 5.0000 mg | ORAL_TABLET | ORAL | Status: DC | PRN

## 2018-06-23 MED ORDER — ONDANSETRON HCL 4 MG/2ML IJ SOLN: 4.0000 mg | Freq: Four times a day (QID) | INTRAMUSCULAR | Status: DC | PRN

## 2018-06-23 MED ORDER — ACETAMINOPHEN 325 MG PO TABS: 650.0000 mg | ORAL_TABLET | ORAL | Status: DC | PRN

## 2018-06-23 MED ORDER — LACTATED RINGERS IV SOLN: INTRAVENOUS | Status: DC

## 2018-06-23 MED ORDER — DIPHENHYDRAMINE HCL 50 MG/ML IJ SOLN: 12.5000 mg | INTRAMUSCULAR | Status: DC | PRN

## 2018-06-23 MED ORDER — OXYTOCIN BOLUS FROM INFUSION
500.0000 mL | Freq: Once | INTRAVENOUS | Status: AC
  Administered 2018-06-23: 500 mL via INTRAVENOUS

## 2018-06-23 MED ORDER — OXYCODONE-ACETAMINOPHEN 5-325 MG PO TABS: 1.0000 | ORAL_TABLET | ORAL | Status: DC | PRN

## 2018-06-23 MED ORDER — OXYTOCIN 40 UNITS IN LACTATED RINGERS INFUSION - SIMPLE MED: 1.0000 m[IU]/min | INTRAVENOUS | Status: DC

## 2018-06-23 MED ORDER — OXYTOCIN 40 UNITS IN LACTATED RINGERS INFUSION - SIMPLE MED
2.5000 [IU]/h | INTRAVENOUS | Status: DC
  Administered 2018-06-23: 2.5 [IU]/h via INTRAVENOUS
  Filled 2018-06-23: qty 1000

## 2018-06-23 MED ORDER — TETANUS-DIPHTH-ACELL PERTUSSIS 5-2.5-18.5 LF-MCG/0.5 IM SUSP: 0.5000 mL | Freq: Once | INTRAMUSCULAR | Status: DC

## 2018-06-23 MED ORDER — LACTATED RINGERS IV SOLN
INTRAVENOUS | Status: DC
  Administered 2018-06-23 (×2): via INTRAVENOUS

## 2018-06-23 MED ORDER — ONDANSETRON HCL 4 MG/2ML IJ SOLN: 4.0000 mg | INTRAMUSCULAR | Status: DC | PRN

## 2018-06-23 MED ORDER — MISOPROSTOL 50MCG HALF TABLET
50.0000 ug | ORAL_TABLET | ORAL | Status: DC | PRN
  Filled 2018-06-23: qty 1

## 2018-06-23 MED ORDER — LACTATED RINGERS IV SOLN: 500.0000 mL | Freq: Once | INTRAVENOUS | Status: DC

## 2018-06-23 MED ORDER — PHENYLEPHRINE 40 MCG/ML (10ML) SYRINGE FOR IV PUSH (FOR BLOOD PRESSURE SUPPORT)
80.0000 ug | PREFILLED_SYRINGE | INTRAVENOUS | Status: DC | PRN
  Filled 2018-06-23: qty 5
  Filled 2018-06-23: qty 10

## 2018-06-23 MED ORDER — LIDOCAINE HCL (PF) 1 % IJ SOLN: 30.0000 mL | INTRAMUSCULAR | Status: DC | PRN

## 2018-06-23 MED ORDER — SOD CITRATE-CITRIC ACID 500-334 MG/5ML PO SOLN: 30.0000 mL | ORAL | Status: DC | PRN

## 2018-06-23 MED ORDER — PRENATAL MULTIVITAMIN CH
1.0000 | ORAL_TABLET | Freq: Every day | ORAL | Status: DC
  Administered 2018-06-23 – 2018-06-25 (×3): 1 via ORAL
  Filled 2018-06-23 (×3): qty 1

## 2018-06-23 MED ORDER — LACTATED RINGERS IV SOLN: 500.0000 mL | INTRAVENOUS | Status: DC | PRN

## 2018-06-23 MED ORDER — FENTANYL CITRATE (PF) 100 MCG/2ML IJ SOLN
50.0000 ug | INTRAMUSCULAR | Status: DC | PRN
  Administered 2018-06-23: 100 ug via INTRAVENOUS

## 2018-06-23 MED ORDER — FENTANYL CITRATE (PF) 100 MCG/2ML IJ SOLN: 50.0000 ug | INTRAMUSCULAR | Status: DC | PRN

## 2018-06-23 MED ORDER — EPHEDRINE 5 MG/ML INJ
10.0000 mg | INTRAVENOUS | Status: DC | PRN
  Filled 2018-06-23: qty 2

## 2018-06-23 MED ORDER — TERBUTALINE SULFATE 1 MG/ML IJ SOLN
0.2500 mg | Freq: Once | INTRAMUSCULAR | Status: DC | PRN
  Filled 2018-06-23: qty 1

## 2018-06-23 MED ORDER — WITCH HAZEL-GLYCERIN EX PADS: 1.0000 "application " | MEDICATED_PAD | CUTANEOUS | Status: DC | PRN

## 2018-06-23 MED ORDER — DIPHENHYDRAMINE HCL 25 MG PO CAPS: 25.0000 mg | ORAL_CAPSULE | Freq: Four times a day (QID) | ORAL | Status: DC | PRN

## 2018-06-23 MED ORDER — ONDANSETRON HCL 4 MG PO TABS: 4.0000 mg | ORAL_TABLET | ORAL | Status: DC | PRN

## 2018-06-23 MED ORDER — OXYTOCIN BOLUS FROM INFUSION: 500.0000 mL | Freq: Once | INTRAVENOUS | Status: DC

## 2018-06-23 MED ORDER — OXYCODONE-ACETAMINOPHEN 5-325 MG PO TABS: 2.0000 | ORAL_TABLET | ORAL | Status: DC | PRN

## 2018-06-23 MED ORDER — COCONUT OIL OIL
1.0000 "application " | TOPICAL_OIL | Status: DC | PRN
  Administered 2018-06-24: 1 via TOPICAL
  Filled 2018-06-23: qty 120

## 2018-06-23 MED ORDER — FLEET ENEMA 7-19 GM/118ML RE ENEM: 1.0000 | ENEMA | RECTAL | Status: DC | PRN

## 2018-06-23 MED ORDER — PHENYLEPHRINE 40 MCG/ML (10ML) SYRINGE FOR IV PUSH (FOR BLOOD PRESSURE SUPPORT)
80.0000 ug | PREFILLED_SYRINGE | INTRAVENOUS | Status: DC | PRN
  Administered 2018-06-23: 80 ug via INTRAVENOUS
  Filled 2018-06-23: qty 5

## 2018-06-23 MED ORDER — IBUPROFEN 600 MG PO TABS
600.0000 mg | ORAL_TABLET | Freq: Four times a day (QID) | ORAL | Status: DC
  Administered 2018-06-23 – 2018-06-25 (×9): 600 mg via ORAL
  Filled 2018-06-23 (×9): qty 1

## 2018-06-23 MED ORDER — LIDOCAINE HCL (PF) 1 % IJ SOLN
INTRAMUSCULAR | Status: DC | PRN
  Administered 2018-06-23 (×2): 4 mL via EPIDURAL
  Administered 2018-06-23: 6 mL via EPIDURAL

## 2018-06-23 MED ORDER — OXYTOCIN 40 UNITS IN LACTATED RINGERS INFUSION - SIMPLE MED: 2.5000 [IU]/h | INTRAVENOUS | Status: DC

## 2018-06-23 MED ORDER — BENZOCAINE-MENTHOL 20-0.5 % EX AERO: 1.0000 "application " | INHALATION_SPRAY | CUTANEOUS | Status: DC | PRN

## 2018-06-23 MED ORDER — FENTANYL 2.5 MCG/ML BUPIVACAINE 1/10 % EPIDURAL INFUSION (WH - ANES)
14.0000 mL/h | INTRAMUSCULAR | Status: DC | PRN
  Administered 2018-06-23: 14 mL/h via EPIDURAL
  Filled 2018-06-23: qty 100

## 2018-06-23 MED ORDER — ZOLPIDEM TARTRATE 5 MG PO TABS: 5.0000 mg | ORAL_TABLET | Freq: Every evening | ORAL | Status: DC | PRN

## 2018-06-23 MED ORDER — SIMETHICONE 80 MG PO CHEW: 80.0000 mg | CHEWABLE_TABLET | ORAL | Status: DC | PRN

## 2018-06-23 MED ORDER — LIDOCAINE HCL (PF) 1 % IJ SOLN
30.0000 mL | INTRAMUSCULAR | Status: DC | PRN
  Filled 2018-06-23: qty 30

## 2018-06-23 MED ORDER — SENNOSIDES-DOCUSATE SODIUM 8.6-50 MG PO TABS
2.0000 | ORAL_TABLET | ORAL | Status: DC
  Administered 2018-06-24 (×2): 2 via ORAL
  Filled 2018-06-23 (×2): qty 2

## 2018-06-23 MED ORDER — FENTANYL CITRATE (PF) 100 MCG/2ML IJ SOLN
INTRAMUSCULAR | Status: AC
  Filled 2018-06-23: qty 2

## 2018-06-23 NOTE — H&P (Addendum)
OBSTETRIC ADMISSION HISTORY AND PHYSICAL  Loretta [name] is a 32 y.o. female G3P2002 with IUP at [redacted]w[redacted]d by LMP presenting for SOL. She reports +FMs, No LOF, no VB, no blurry vision, headaches or peripheral edema, and RUQ pain.  She plans on breastfeeding. She request Nexplanon for birth control. She received her prenatal [facility]  Dating: By LMP --->  Estimated Date of Delivery: 06/17/18  Sono:  @[redacted]w[redacted]d , CWD, normal anatomy, breech presentation, 211g, 45% EFW   Prenatal History/Complications:  Past Medical History: History reviewed. No pertinent past medical history.  Past Surgical History: History reviewed. No pertinent surgical history.  Obstetrical History: OB History    Gravida  3   Para  2   Term  2   Preterm      AB      Living  2     SAB      TAB      Ectopic      Multiple      Live Births  2           Social History: Social History   Socioeconomic History  . Marital status: Married    Spouse name: Not on file  . Number of children: Not on file  . Years of education: Not on file  . Highest education level: Not on file  Occupational History  . Not on file  Social Needs  . Financial resource strain: Not on file  . Food insecurity:    Worry: Not on file    Inability: Not on file  . Transportation needs:    Medical: Not on file    Non-medical: Not on file  Tobacco Use  . Smoking status: Never Smoker  . Smokeless tobacco: Never Used  Substance and Sexual Activity  . Alcohol use: No    Frequency: Never  . Drug use: No  . Sexual activity: Yes    Birth control/protection: None  Lifestyle  . Physical activity:    Days per week: Not on file    Minutes per session: Not on file  . Stress: Not on file  Relationships  . Social connections:    Talks on phone: Not on file    Gets together: Not on file    Attends religious service: Not on file    Active member of club or organization: Not on file    Attends meetings of  clubs or organizations: Not on file    Relationship status: Not on file  Other Topics Concern  . Not on file  Social History Narrative  . Not on file    Family History: History reviewed. No pertinent family history.  Allergies: No Known Allergies  Medications Prior to Admission  Medication Sig Dispense Refill Last Dose  . Prenatal Multivit-Min-Fe-FA (PRENATAL VITAMINS PO) Take by mouth.   06/22/2018 at Unknown time  . ferrous fumarate (HEMOCYTE - 106 MG FE) 325 (106 Fe) MG TABS tablet Take 1 tablet (106 mg of iron total) by mouth 2 (two) times daily. 60 each 0 Taking  . lansoprazole (PREVACID) 30 MG capsule Take 15 mg by mouth daily at 12 noon.   Taking     Review of Systems   All systems reviewed and negative except as stated in HPI  Blood pressure 118/77, pulse 86, temperature 98.5 F (36.9 C), temperature source Oral, resp. rate 18, height 5\' 3"  (1.6 m), weight 66.2 kg, last menstrual period 09/10/2017. General appearance: alert, cooperative, appears stated age and  moderate distress Lungs: no extra work of breathing  Presentation: cephalic confirmed by CNM in MAU  Fetal monitoringBaseline: 130 bpm, Variability: Good {> 6 bpm), Accelerations: Reactive and Decelerations: Absent Uterine activity contractions q5 mins   Prenatal labs: ABO, Rh: --/--/O POS (09/01 1610) Antibody: NEG (09/01 9604) Rubella: 16.70 (02/15 0941) RPR: Non Reactive (06/06 0854)  HBsAg: Negative (02/15 0941)  HIV: Non Reactive (06/06 0854)  GBS:   negative 1 hr Glucola 125 Genetic screening  normal Anatomy US Normal anatomy, U/S noted choroid plexus cyst right side, most likely non-pathologic due to normal cfDNA   Prenatal Transfer Tool  Maternal Diabetes: No Genetic Screening: Normal Maternal Ultrasounds/Referrals: Normal U/S noted right sided choroid plexus cyst (most likely non pathologic due to normal cfDNA)  Fetal Ultrasounds or other Referrals:  Referred to Materal Fetal Medicine  Maternal  Substance Abuse:  No Significant Maternal Medications:  Meds include: Other: Iron, prenatal, lansoprazole  Significant Maternal Lab Results: Lab values include: Group B Strep negative  Results for orders placed or performed during the hospital encounter of 06/23/18 (from the past 24 hour(s))  CBC   Collection Time: 06/23/18  3:12 AM  Result Value Ref Range   WBC 7.9 4.0 - 10.5 K/uL   RBC 3.93 3.87 - 5.11 MIL/uL   Hemoglobin 10.8 (L) 12.0 - 15.0 g/dL   HCT 54.0 (L) 98.1 - 19.1 %   MCV 83.7 78.0 - 100.0 fL   MCH 27.5 26.0 - 34.0 pg   MCHC 32.8 30.0 - 36.0 g/dL   RDW 47.8 (H) 29.5 - 62.1 %   Platelets 343 150 - 400 K/uL  Type and screen [facility] [place] [facility]   Collection Time: 06/23/18  3:12 AM  Result Value Ref Range   ABO/RH(D) O POS    Antibody Screen NEG    Sample Expiration      06/26/2018 Performed at [facility], [address], [place], [place] [address]     Patient Active Problem List   Diagnosis Date Noted  . Normal labor and delivery 06/23/2018  . Post-dates pregnancy 06/23/2018  . Anemia affecting pregnancy in third trimester 05/16/2018  . Unwanted fertility 02/12/2018  . Language barrier 02/12/2018  . Gastroesophageal reflux disease 12/27/2017  . Supervision of other normal pregnancy, antepartum 12/07/2017    Assessment/Plan:  Loretta [name] is a 32 y.o. G3P2002 at [redacted]w[redacted]d here for SOL.   #Labor: Patient presented to MAU contracting q5 mins (contractions started 2200 on 8/31, membranes intact, no bleeding,  #Pain: IV fentanyl tried and patient is desirous of epidural.  #FWB: Cat 1.  #ID:  GBS neg #MOF: Breast #MOC: Nexplanon  #Circ:  N/A (girl)  Raynelle Highland, Medical Student  06/23/2018, 4:12 AM  CNM attestation:  I have seen and examined this patient; I agree with above documentation in the med student's note.   Loretta [name] is a 32 y.o. [phone] here for SOL  PE: BP 105/65 (BP Location: Left Arm)    Pulse 82   Temp 99 F (37.2 C) (Oral)   Resp 20   Ht 5\' 3"  (1.6 m)   Wt 66.2 kg   LMP 09/10/2017 (Exact Date)   SpO2 100%   Breastfeeding? Unknown   BMI 25.86 kg/m   Resp: normal effort, no distress Abd: gravid  ROS, labs, PMH reviewed  Plan: Admit to [facility] Expectant management Anticipate SVD  Cam Hai CNM 06/23/2018, 8:56 AM

## 2018-06-23 NOTE — Progress Notes (Signed)
This note also relates to the following rows which could not be included: BP - Cannot attach notes to unvalidated device data SpO2 - Cannot attach notes to unvalidated device data   Video interpretor French Ana [phone] used to consent for epidural and during epidural procedure.

## 2018-06-23 NOTE — Progress Notes (Signed)
This note also relates to the following rows which could not be included: SpO2 - Cannot attach notes to unvalidated device data  Video Interpretor Lil used for delivery.

## 2018-06-23 NOTE — MAU Note (Signed)
Pt. States ctx started at 2300 and are every 5 minutes. Denies leaking or bleeding.

## 2018-06-23 NOTE — Progress Notes (Signed)
Assessment completed with use of interpretor [phone].

## 2018-06-23 NOTE — Lactation Note (Signed)
This note was copied from a baby's chart. Lactation Consultation Note  Patient Name: Loretta [name] [id number] Date: 06/23/2018 Reason for consult: Initial assessment;Term  P3 mother whose infant is now 82 hours old.  Mother did not breastfeed her first child but breastfed her second child for 1 year.    Paco, interpreter [phone] used for interpretation until he was removed from the screen and then Noemi #[id number] used for interpretation  Mother has no questions/concerns related to breastfeeding at this time.  Encouraged her to feed 8-12 times/24 hours or sooner if baby shows feeding cues.  Reviewed feeding cues.  Discussed the importance of hand expression and mother will hand express before/after feeds.  Plastic spoon provided for any EBM she may obtain with hand expression.    Baby laying in bed with mother so I did not assess breasts and nipples but mother states she has no concerns.  I encouraged using EBM to rub into nipples and areolas after feedings.  I reminded her to call for latch assistance as needed.  Mother verbalized understanding.  Family came in to visit as I was ending my session with mother.     Maternal Data Formula Feeding for Exclusion: No Has patient been taught Hand Expression?: Yes Does the patient have breastfeeding experience prior to this delivery?: Yes  Feeding    LATCH Score                   Interventions    Lactation Tools Discussed/Used     Consult Status Consult Status: Follow-up Date: 06/24/18 Follow-up type: In-patient    Bobie Kistler R Sharene Krikorian 06/23/2018, 2:35 PM

## 2018-06-23 NOTE — Anesthesia Procedure Notes (Signed)
Epidural Patient location during procedure: OB Start time: 06/23/2018 4:40 AM End time: 06/23/2018 4:50 AM  Staffing Anesthesiologist: Elmer Picker, MD Performed: anesthesiologist   Preanesthetic Checklist Completed: patient identified, pre-op evaluation, timeout performed, IV checked, risks and benefits discussed and monitors and equipment checked  Epidural Patient position: sitting Prep: site prepped and draped and DuraPrep Patient monitoring: continuous pulse ox, blood pressure, heart rate and cardiac monitor Approach: midline Location: L3-L4 Injection technique: LOR air  Needle:  Needle type: Tuohy  Needle gauge: 17 G Needle length: 9 cm Needle insertion depth: 4.5 cm Catheter type: closed end flexible Catheter size: 19 Gauge Catheter at skin depth: 10 cm Test dose: negative  Assessment Sensory level: T8 Events: blood not aspirated, injection not painful, no injection resistance, negative IV test and no paresthesia  Additional Notes Patient identified. Risks/Benefits/Options discussed with patient including but not limited to bleeding, infection, nerve damage, paralysis, failed block, incomplete pain control, headache, blood pressure changes, nausea, vomiting, reactions to medication both or allergic, itching and postpartum back pain. Confirmed with bedside nurse the patient's most recent platelet count. Confirmed with patient that they are not currently taking any anticoagulation, have any bleeding history or any family history of bleeding disorders. Patient expressed understanding and wished to proceed. All questions were answered. Sterile technique was used throughout the entire procedure. Please see nursing notes for vital signs. Test dose was given through epidural catheter and negative prior to continuing to dose epidural or start infusion. Warning signs of high block given to the patient including shortness of breath, tingling/numbness in hands, complete motor block,  or any concerning symptoms with instructions to call for help. Patient was given instructions on fall risk and not to get out of bed. All questions and concerns addressed with instructions to call with any issues or inadequate analgesia.  Reason for block:procedure for pain

## 2018-06-23 NOTE — Anesthesia Postprocedure Evaluation (Signed)
Anesthesia Post Note  Patient: [name]  Procedure(s) Performed: AN AD HOC LABOR EPIDURAL     Patient location during evaluation: Mother Baby Anesthesia Type: Epidural Level of consciousness: awake and alert and oriented Pain management: satisfactory to patient Vital Signs Assessment: post-procedure vital signs reviewed and stable Respiratory status: spontaneous breathing and nonlabored ventilation Cardiovascular status: stable Postop Assessment: no headache, no backache, no signs of nausea or vomiting, adequate PO intake and patient able to bend at knees (patient up walking) Anesthetic complications: no    Last Vitals:  Vitals:   06/23/18 0828 06/23/18 0919  BP: 105/65 102/66  Pulse: 82 72  Resp: 20 20  Temp: 37.2 C 36.8 C  SpO2: 100%     Last Pain:  Vitals:   06/23/18 0919  TempSrc: Oral  PainSc:    Pain Goal: Patients Stated Pain Goal: 0 (06/23/18 0250)               Madison Hickman

## 2018-06-23 NOTE — MAU Note (Signed)
Ctxs since 2200. Denies LOF or bleeding. 1cm last sve

## 2018-06-23 NOTE — Anesthesia Preprocedure Evaluation (Signed)
Anesthesia Evaluation  Patient identified by MRN, date of birth, ID band Patient awake    Reviewed: Allergy & Precautions, NPO status , Patient's Chart, lab work & pertinent test results  Airway Mallampati: I  TM Distance: >3 FB Neck ROM: Full    Dental no notable dental hx.    Pulmonary neg pulmonary ROS,    Pulmonary exam normal breath sounds clear to auscultation       Cardiovascular negative cardio ROS Normal cardiovascular exam Rhythm:Regular Rate:Normal     Neuro/Psych negative neurological ROS  negative psych ROS   GI/Hepatic Neg liver ROS, GERD  ,  Endo/Other  negative endocrine ROS  Renal/GU negative Renal ROS  negative genitourinary   Musculoskeletal negative musculoskeletal ROS (+)   Abdominal   Peds  Hematology  (+) anemia ,   Anesthesia Other Findings   Reproductive/Obstetrics (+) Pregnancy                             Anesthesia Physical Anesthesia Plan  ASA: II  Anesthesia Plan: Epidural   Post-op Pain Management:    Induction:   PONV Risk Score and Plan: Treatment may vary due to age or medical condition  Airway Management Planned: Natural Airway  Additional Equipment:   Intra-op Plan:   Post-operative Plan:   Informed Consent: I have reviewed the patients History and Physical, chart, labs and discussed the procedure including the risks, benefits and alternatives for the proposed anesthesia with the patient or authorized representative who has indicated his/her understanding and acceptance.     Plan Discussed with: Anesthesiologist  Anesthesia Plan Comments: (Patient identified. Risks, benefits, options discussed with patient including but not limited to bleeding, infection, nerve damage, paralysis, failed block, incomplete pain control, headache, blood pressure changes, nausea, vomiting, reactions to medication, itching, and post partum back pain.  Confirmed with bedside nurse the patient's most recent platelet count. Confirmed with the patient that they are not taking any anticoagulation, have any bleeding history or any family history of bleeding disorders. Patient expressed understanding and wishes to proceed. All questions were answered. )        Anesthesia Quick Evaluation

## 2018-06-24 NOTE — Progress Notes (Signed)
Post Partum Day 1 Subjective: no complaints, up ad lib, voiding and tolerating PO Interpretor used  Objective: Blood pressure 96/60, pulse 75, temperature 98.3 F (36.8 C), temperature source Oral, resp. rate 16, height 5\' 3"  (1.6 m), weight 66.2 kg, last menstrual period 09/10/2017, SpO2 100 %, unknown if currently breastfeeding.  Physical Exam:  General: alert, cooperative and no distress Lochia: appropriate Uterine Fundus: firm Incision: n/a DVT Evaluation: No evidence of DVT seen on physical exam.  Recent Labs    06/23/18 0312  HGB 10.8*  HCT 32.9*    Assessment/Plan: Plan for discharge tomorrow and Breastfeeding Baby under bili blanket until tomorrow  LOS: 1 day   Wynelle Bourgeois 06/24/2018, 9:28 AM

## 2018-06-24 NOTE — Progress Notes (Signed)
House interpreter used for communication of LEAD, additional spot light for baby's photo therapy, and double electric breast pump set-up. Informed parents of plan of care for the evening. Patient verbalized understanding of plan of care. Patient tearful over additional photo therapy. Support offered. Patient verbalized no other needs at this time.

## 2018-06-24 NOTE — Lactation Note (Signed)
This note was copied from a baby's chart. Lactation Consultation Note  Patient Name: [name] Date: 06/24/2018 Reason for consult: Follow-up assessment   Video interpreter used for Spanish. P3, Baby 32 hours old.  Ex BF for one year. Mother has small nipples that are pink and tender.  Provided mother w/ coconut oil. Assisted mother w/ latching.  Mother latching in cradle hold.  Tried to reposition baby to cross cradle having trouble. Baby needing more depth so helped baby move to football position. Intermittent swallows observed. Provided coconut oil and reviewed hand pump and suggest spoon feeding. Mom encouraged to feed baby 8-12 times/24 hours and with feeding cues.        Maternal Data Has patient been taught Hand Expression?: Yes Does the patient have breastfeeding experience prior to this delivery?: Yes  Feeding Feeding Type: Breast Fed Length of feed: 60 min  LATCH Score Latch: Repeated attempts needed to sustain latch, nipple held in mouth throughout feeding, stimulation needed to elicit sucking reflex.  Audible Swallowing: A few with stimulation  Type of Nipple: Everted at rest and after stimulation  Comfort (Breast/Nipple): Filling, red/small blisters or bruises, mild/mod discomfort  Hold (Positioning): Assistance needed to correctly position infant at breast and maintain latch.  LATCH Score: 6  Interventions Interventions: Breast feeding basics reviewed;Assisted with latch;Skin to skin;Breast massage;Hand express;Breast compression;Support pillows;Hand pump;Coconut oil  Lactation Tools Discussed/Used     Consult Status Consult Status: Follow-up Date: 06/25/18 Follow-up type: In-patient    Loretta [name] [facility] 06/24/2018, 2:17 PM

## 2018-06-25 ENCOUNTER — Inpatient Hospital Stay (HOSPITAL_COMMUNITY): Admission: RE | Admit: 2018-06-25 | Payer: Medicaid Other | Source: Ambulatory Visit

## 2018-06-25 NOTE — Discharge Summary (Signed)
OB Discharge Summary     Patient Name: Loretta [name] DOB: 05-06-1986 MRN: [id number]  Date of admission: 06/23/2018 Delivering MD: Raynelle Highland   Date of discharge: 06/25/2018  Admitting diagnosis: 41wks ctxs pain Intrauterine pregnancy: [redacted]w[redacted]d     Secondary diagnosis:  Active Problems:   Normal labor and delivery   Post-dates pregnancy  Additional problems: GERD, Language barrier, anemia affecting pregnancy      Discharge diagnosis: Term Pregnancy Delivered and Anemia                                                                                                Post partum procedures:none  Augmentation: none  Complications: None  Hospital course:  Onset of Labor With Vaginal Delivery     32 y.o. yo G3P3003 at [redacted]w[redacted]d was admitted in Active Labor on 06/23/2018. Patient had an uncomplicated labor course as follows:  Membrane Rupture Time/Date: 5:36 AM ,06/23/2018   Intrapartum Procedures: Episiotomy: None [1]                                         Lacerations:  1st degree [2]  Patient had a delivery of a Viable infant. 06/23/2018  Information for the patient's newborn:  Myrta Mercer, Girl Symantha [[id number]]  Delivery Method: Vaginal, Spontaneous(Filed from Delivery Summary)    Pateint had an uncomplicated postpartum course.  She is ambulating, tolerating a regular diet, passing flatus, and urinating well. Patient is discharged home in stable condition on 06/25/18.   Physical exam  Vitals:   06/24/18 0620 06/24/18 1544 06/24/18 2210 06/25/18 0603  BP: 96/60 107/75 111/80 106/76  Pulse: 75 97 85 85  Resp: 16 19 16 18   Temp: 98.3 F (36.8 C) 99.1 F (37.3 C) 98.6 F (37 C) 98.7 F (37.1 C)  TempSrc: Oral Oral Oral Oral  SpO2: 100% 100%  100%  Weight:      Height:       General: alert, cooperative and no distress Lochia: appropriate Uterine Fundus: firm Incision: N/A DVT Evaluation: No evidence of DVT seen on physical exam. No cords or calf  tenderness. No significant calf/ankle edema. Labs: Lab Results  Component Value Date   WBC 7.9 06/23/2018   HGB 10.8 (L) 06/23/2018   HCT 32.9 (L) 06/23/2018   MCV 83.7 06/23/2018   PLT 343 06/23/2018   No flowsheet data found.  Discharge instruction: per After Visit Summary and "Baby and Me Booklet".  After visit meds:  Allergies as of 06/25/2018   No Known Allergies     Medication List    TAKE these medications   ferrous fumarate 325 (106 Fe) MG Tabs tablet Commonly known as:  HEMOCYTE - 106 mg FE Take 1 tablet (106 mg of iron total) by mouth 2 (two) times daily.   lansoprazole 15 MG capsule Commonly known as:  PREVACID Take 15 mg by mouth daily at 12 noon.   PRENATAL VITAMINS PO Take by mouth.       Diet:  routine diet  Activity: Advance as tolerated. Pelvic rest for 6 weeks.   Outpatient follow up:4 weeks Follow up Appt:No future appointments. Follow up Visit:No follow-ups on file.  Postpartum contraception: Nexplanon  Newborn Data: Live born female  Birth Weight: 7 lb 8.6 oz (3420 g) APGAR: 9, 9  Newborn Delivery   Birth date/time:  06/23/2018 06:17:00 Delivery type:  Vaginal, Spontaneous     Baby Feeding: Breast Disposition:rooming in   06/25/2018 Sharyon Cable, CNM

## 2018-06-26 ENCOUNTER — Ambulatory Visit: Payer: Self-pay

## 2018-06-26 NOTE — Lactation Note (Signed)
This note was copied from a baby's chart. Lactation Consultation Note  Patient Name: [name] [id number] Date: 06/26/2018 Reason for consult: Follow-up assessment;Other (Comment)(check is mom received relieve from the engorgement with ice for 15- 20 mins , and pumping / hand pump or feed )  1st visit , as LC and interpreter entered the room, mom feeding the baby on the right breast, with no support, fully dressed, swallows noted and per mom comfortable.  LC reviewed basics, and especially for post Double photo tx.  LC stressed the importance of STS feedings until the baby can stay awake for a feeding, back to birth weight, and gaining steadily.  LC updated doc flow sheets per mom. LC noted the baby has been cluster feeding.  LC reviewed sore nipple and engorgement prevention and tx.  LC noted when mom was re- latching both breast to be very full, and the lateral aspects Not have swollen milk ducts.  LC provided ice packs x 2 and recommended mom ice for 15-20 mins/ pump or feed.   LC 2nd visit  , [facility] interpreter Loretta [name] [phone])  Mom followed directions from [facility] and iced for 15 -20 mins , pumped off fullness.and then  fed for her baby for 3-4 mins. LC wasn't surprised mom only fed for 3-5 mins due to her cluster feeding .  Mom pumped off 25 -30 ml, and mentioned her breast were more comfortable.  For sore nipples , LC provided and instructed mom on the use comfort gels after feedings x 6 days, when warm rinse with warm water and place in the refrigerator. Then alterate with the breast shells except when sleeping. Both nipples have positional strips.  Per mom breast feel so much better. Reviewed engorgement prevention again.   LC stressed again , STS feedings until the baby can stay awake for a feeding, and back up to birth weight.    Maternal Data Has patient been taught Hand Expression?: Yes  Feeding Feeding Type: (baby already latched,breast are full to boaderline  engorged lateral apects ) Length of feed: 10 min(per mom )  LATCH Score Latch: (latched without support and dressed )  Audible Swallowing: (swallows noted )  Type of Nipple: (when baby released noted a positional strip on the nipples )  Comfort (Breast/Nipple): (right breast soften, lateral areas still full )  Hold (Positioning): (mom independent with latch )     Interventions Interventions: Breast feeding basics reviewed;Shells;Comfort gels;Hand pump  Lactation Tools Discussed/Used Tools: Shells;Pump;Comfort gels Flange Size: 24 Shell Type: Inverted Breast pump type: Manual Pump Review: Setup, frequency, and cleaning Initiated by:: LC reviewed/ MAI    Consult Status Consult Status: Complete Date: 06/26/18 Follow-up type: In-patient    Loretta [name] 06/26/2018, 6:27 PM

## 2018-07-01 ENCOUNTER — Encounter: Payer: Self-pay | Admitting: *Deleted

## 2018-07-29 ENCOUNTER — Encounter: Payer: Self-pay | Admitting: Advanced Practice Midwife

## 2018-07-29 ENCOUNTER — Ambulatory Visit (INDEPENDENT_AMBULATORY_CARE_PROVIDER_SITE_OTHER): Payer: Medicaid Other | Admitting: Advanced Practice Midwife

## 2018-07-29 DIAGNOSIS — Z3046 Encounter for surveillance of implantable subdermal contraceptive: Secondary | ICD-10-CM | POA: Diagnosis not present

## 2018-07-29 DIAGNOSIS — Z3202 Encounter for pregnancy test, result negative: Secondary | ICD-10-CM

## 2018-07-29 DIAGNOSIS — Z1389 Encounter for screening for other disorder: Secondary | ICD-10-CM

## 2018-07-29 DIAGNOSIS — Z30017 Encounter for initial prescription of implantable subdermal contraceptive: Secondary | ICD-10-CM

## 2018-07-29 MED ORDER — ETONOGESTREL 68 MG ~~LOC~~ IMPL
68.0000 mg | DRUG_IMPLANT | Freq: Once | SUBCUTANEOUS | Status: AC
  Administered 2018-07-29: 68 mg via SUBCUTANEOUS

## 2018-07-29 NOTE — Progress Notes (Signed)
Pt states is having a lot of back pain, taking Tylenol, helps some. Pt also wants to know is she has stitches due to a tear.

## 2018-07-29 NOTE — Progress Notes (Signed)
Pt wants Nexplanon today.

## 2018-07-29 NOTE — Progress Notes (Signed)
     Subjective:  Loretta [name] is a 32 y.o. female who presents for a postpartum visit. She is 5 weeks postpartum following a spontaneous vaginal delivery. I have fully reviewed the prenatal and intrapartum course. The delivery was at 40/6 gestational weeks. Outcome: spontaneous vaginal delivery. Anesthesia: epidural. Postpartum course has been uncomplicated. Baby's course has been uncomplicated. Baby is feeding by breast. Bleeding no bleeding. Bowel function is normal. Bladder function is normal. Patient is not sexually active. Contraception method is nexplanon today. Postpartum depression screening: negative.  Patient reports that she is having a strange sensation in her perineum that feels like her stitches are still there. It started about 10 days ago. Denies vaginal burning or itching. Has a small amount of discharge that she reports has a smell. Requesting an exam today.   The following portions of the patient's history were reviewed and updated as appropriate: allergies, current medications, past family history, past medical history, past social history, past surgical history and problem list.  Objective:    LMP 09/10/2017 (Exact Date)   Physical Exam  Constitutional: She is oriented to person, place, and time. She appears well-developed and well-nourished. No distress.  HENT:  Head: Normocephalic and atraumatic.  Right Ear: External ear normal.  Left Ear: External ear normal.  Eyes: EOM are normal. Right eye exhibits no discharge. Left eye exhibits no discharge. No scleral icterus.  Genitourinary: Vaginal discharge (white, small amount) found.  Neurological: She is alert and oriented to person, place, and time.  Skin: Skin is warm and dry. She is not diaphoretic.  Psychiatric: She has a normal mood and affect. Her behavior is normal. Judgment and thought content normal.         GYNECOLOGY OFFICE PROCEDURE NOTE  Loretta [name] is a 32 y.o.  [phone] here for Nexplanon insertion.  Last pap smear was on 12/07/2017 and was NIL/HPV positive.  No other gynecologic concerns.  Nexplanon Insertion Procedure Patient identified, informed consent performed, consent signed.   Patient does understand that irregular bleeding is a very common side effect of this medication. She was advised to have backup contraception for one week after placement. Pregnancy test in clinic today was negative.  Appropriate time out taken.  Patient's left arm was prepped and draped in the usual sterile fashion. The ruler used to measure and mark insertion area.  Patient was prepped with alcohol swab and then injected with 3 ml of 1% lidocaine.  She was prepped with betadine, Nexplanon removed from packaging,  Device confirmed in needle, then inserted full length of needle and withdrawn per handbook instructions. Nexplanon was able to palpated in the patient's arm; patient palpated the insert herself. There was minimal blood loss.  Patient insertion site covered with guaze and a pressure bandage to reduce any bruising.  The patient tolerated the procedure well and was given post procedure instructions.   Center for [employer], [facility]      Assessment:     Normal postpartum exam. Pap smear not done at today's visit.   Plan:    1. Contraception: Nexplanon placed today 2. Examined perineum, which is normal appearing; reassurance provided 3. Follow up in: 6 month for repeat papsmear given HPV positive or as needed.

## 2018-08-23 DIAGNOSIS — K819 Cholecystitis, unspecified: Secondary | ICD-10-CM

## 2018-08-23 HISTORY — DX: Cholecystitis, unspecified: K81.9

## 2018-09-02 ENCOUNTER — Other Ambulatory Visit: Payer: Self-pay

## 2018-09-02 ENCOUNTER — Inpatient Hospital Stay (HOSPITAL_COMMUNITY)
Admission: EM | Admit: 2018-09-02 | Discharge: 2018-09-03 | DRG: 446 | Disposition: A | Discharge status: Home / Self Care | Payer: Medicaid Other | Attending: Family Medicine | Admitting: Family Medicine

## 2018-09-02 ENCOUNTER — Encounter (HOSPITAL_COMMUNITY): Payer: Self-pay | Admitting: *Deleted

## 2018-09-02 DIAGNOSIS — K8070 Calculus of gallbladder and bile duct without cholecystitis without obstruction: Principal | ICD-10-CM | POA: Diagnosis present

## 2018-09-02 DIAGNOSIS — Z9049 Acquired absence of other specified parts of digestive tract: Secondary | ICD-10-CM

## 2018-09-02 DIAGNOSIS — K8050 Calculus of bile duct without cholangitis or cholecystitis without obstruction: Secondary | ICD-10-CM | POA: Diagnosis not present

## 2018-09-02 DIAGNOSIS — K219 Gastro-esophageal reflux disease without esophagitis: Secondary | ICD-10-CM | POA: Diagnosis present

## 2018-09-02 DIAGNOSIS — R1011 Right upper quadrant pain: Secondary | ICD-10-CM | POA: Diagnosis not present

## 2018-09-02 DIAGNOSIS — Z79899 Other long term (current) drug therapy: Secondary | ICD-10-CM

## 2018-09-02 DIAGNOSIS — K8020 Calculus of gallbladder without cholecystitis without obstruction: Secondary | ICD-10-CM

## 2018-09-02 HISTORY — DX: Cholecystitis, unspecified: K81.9

## 2018-09-02 LAB — COMPREHENSIVE METABOLIC PANEL
ALBUMIN: 4.2 g/dL (ref 3.5–5.0)
ALT: 73 U/L — AB (ref 0–44)
AST: 58 U/L — AB (ref 15–41)
Alkaline Phosphatase: 107 U/L (ref 38–126)
Anion gap: 9 (ref 5–15)
BILIRUBIN TOTAL: 0.3 mg/dL (ref 0.3–1.2)
BUN: 13 mg/dL (ref 6–20)
CO2: 24 mmol/L (ref 22–32)
CREATININE: 0.65 mg/dL (ref 0.44–1.00)
Calcium: 9.8 mg/dL (ref 8.9–10.3)
Chloride: 105 mmol/L (ref 98–111)
GFR calc Af Amer: >60 mL/min (ref >60)
GFR calc non Af Amer: >60 mL/min (ref >60)
GLUCOSE: 105 mg/dL — AB (ref 70–99)
POTASSIUM: 3.6 mmol/L (ref 3.5–5.1)
Sodium: 138 mmol/L (ref 135–145)
TOTAL PROTEIN: 8.1 g/dL (ref 6.5–8.1)

## 2018-09-02 LAB — CBC
HCT: 40.9 % (ref 36.0–46.0)
Hemoglobin: 13.1 g/dL (ref 12.0–15.0)
MCH: 27 pg (ref 26.0–34.0)
MCHC: 32 g/dL (ref 30.0–36.0)
MCV: 84.2 fL (ref 80.0–100.0)
Platelets: 425 10*3/uL — ABNORMAL HIGH (ref 150–400)
RBC: 4.86 MIL/uL (ref 3.87–5.11)
RDW: 14.5 % (ref 11.5–15.5)
WBC: 10.4 10*3/uL (ref 4.0–10.5)
nRBC: 0 % (ref 0.0–0.2)

## 2018-09-02 LAB — I-STAT BETA HCG BLOOD, ED (MC, WL, AP ONLY): I-stat hCG, quantitative: <5 m[IU]/mL

## 2018-09-02 LAB — LIPASE, BLOOD: Lipase: 41 U/L (ref 11–51)

## 2018-09-02 NOTE — ED Triage Notes (Signed)
Pt c/o abd pain x 2 days. Denies NVD. 2 months post partum, vaginal delivery

## 2018-09-03 ENCOUNTER — Emergency Department (HOSPITAL_COMMUNITY): Payer: Medicaid Other

## 2018-09-03 ENCOUNTER — Inpatient Hospital Stay (HOSPITAL_COMMUNITY): Payer: Medicaid Other

## 2018-09-03 ENCOUNTER — Other Ambulatory Visit: Payer: Self-pay

## 2018-09-03 ENCOUNTER — Encounter (HOSPITAL_COMMUNITY): Payer: Self-pay | Admitting: General Practice

## 2018-09-03 DIAGNOSIS — K8020 Calculus of gallbladder without cholecystitis without obstruction: Secondary | ICD-10-CM

## 2018-09-03 DIAGNOSIS — Z79899 Other long term (current) drug therapy: Secondary | ICD-10-CM | POA: Diagnosis not present

## 2018-09-03 DIAGNOSIS — K8070 Calculus of gallbladder and bile duct without cholecystitis without obstruction: Secondary | ICD-10-CM | POA: Diagnosis present

## 2018-09-03 DIAGNOSIS — K838 Other specified diseases of biliary tract: Secondary | ICD-10-CM | POA: Diagnosis not present

## 2018-09-03 DIAGNOSIS — K8050 Calculus of bile duct without cholangitis or cholecystitis without obstruction: Secondary | ICD-10-CM | POA: Diagnosis not present

## 2018-09-03 DIAGNOSIS — K760 Fatty (change of) liver, not elsewhere classified: Secondary | ICD-10-CM | POA: Diagnosis not present

## 2018-09-03 DIAGNOSIS — K219 Gastro-esophageal reflux disease without esophagitis: Secondary | ICD-10-CM

## 2018-09-03 DIAGNOSIS — R945 Abnormal results of liver function studies: Secondary | ICD-10-CM | POA: Diagnosis not present

## 2018-09-03 DIAGNOSIS — Z9049 Acquired absence of other specified parts of digestive tract: Secondary | ICD-10-CM

## 2018-09-03 DIAGNOSIS — R1011 Right upper quadrant pain: Secondary | ICD-10-CM | POA: Diagnosis present

## 2018-09-03 LAB — COMPREHENSIVE METABOLIC PANEL
ALT: 233 U/L — ABNORMAL HIGH (ref 0–44)
ANION GAP: 6 (ref 5–15)
AST: 189 U/L — AB (ref 15–41)
Albumin: 4 g/dL (ref 3.5–5.0)
Alkaline Phosphatase: 118 U/L (ref 38–126)
BUN: 15 mg/dL (ref 6–20)
CHLORIDE: 106 mmol/L (ref 98–111)
CO2: 26 mmol/L (ref 22–32)
Calcium: 9.2 mg/dL (ref 8.9–10.3)
Creatinine, Ser: 0.77 mg/dL (ref 0.44–1.00)
GFR calc Af Amer: >60 mL/min (ref >60)
GFR calc non Af Amer: >60 mL/min (ref >60)
Glucose, Bld: 102 mg/dL — ABNORMAL HIGH (ref 70–99)
POTASSIUM: 3.6 mmol/L (ref 3.5–5.1)
Sodium: 138 mmol/L (ref 135–145)
Total Bilirubin: 0.6 mg/dL (ref 0.3–1.2)
Total Protein: 7.9 g/dL (ref 6.5–8.1)

## 2018-09-03 LAB — URINALYSIS, ROUTINE W REFLEX MICROSCOPIC
Bilirubin Urine: NEGATIVE
Glucose, UA: NEGATIVE mg/dL
KETONES UR: NEGATIVE mg/dL
Nitrite: NEGATIVE
PH: 6 (ref 5.0–8.0)
Protein, ur: NEGATIVE mg/dL
SPECIFIC GRAVITY, URINE: 1.017 (ref 1.005–1.030)

## 2018-09-03 MED ORDER — GADOBUTROL 1 MMOL/ML IV SOLN
6.0000 mL | Freq: Once | INTRAVENOUS | Status: AC | PRN
  Administered 2018-09-03: 6 mL via INTRAVENOUS

## 2018-09-03 MED ORDER — HEPARIN SODIUM (PORCINE) 5000 UNIT/ML IJ SOLN: 5000.0000 [IU] | Freq: Three times a day (TID) | INTRAMUSCULAR | Status: DC

## 2018-09-03 MED ORDER — SODIUM CHLORIDE 0.9 % IV BOLUS
1000.0000 mL | Freq: Once | INTRAVENOUS | Status: AC
  Administered 2018-09-03: 1000 mL via INTRAVENOUS

## 2018-09-03 MED ORDER — SODIUM CHLORIDE 0.9 % IV SOLN
INTRAVENOUS | Status: DC
  Administered 2018-09-03: 06:00:00 via INTRAVENOUS

## 2018-09-03 MED ORDER — KETOROLAC TROMETHAMINE 30 MG/ML IJ SOLN
30.0000 mg | Freq: Four times a day (QID) | INTRAMUSCULAR | Status: DC | PRN
  Administered 2018-09-03: 30 mg via INTRAVENOUS
  Filled 2018-09-03: qty 1

## 2018-09-03 MED ORDER — ONDANSETRON HCL 4 MG/2ML IJ SOLN
4.0000 mg | Freq: Once | INTRAMUSCULAR | Status: AC
  Administered 2018-09-03: 4 mg via INTRAVENOUS
  Filled 2018-09-03: qty 2

## 2018-09-03 MED ORDER — ONDANSETRON HCL 4 MG/2ML IJ SOLN: 4.0000 mg | Freq: Four times a day (QID) | INTRAMUSCULAR | Status: DC | PRN

## 2018-09-03 MED ORDER — POLYETHYLENE GLYCOL 3350 17 G PO PACK: 17.0000 g | PACK | Freq: Every day | ORAL | Status: DC | PRN

## 2018-09-03 MED ORDER — ONDANSETRON HCL 4 MG PO TABS: 4.0000 mg | ORAL_TABLET | Freq: Four times a day (QID) | ORAL | Status: DC | PRN

## 2018-09-03 MED ORDER — MORPHINE SULFATE (PF) 4 MG/ML IV SOLN
4.0000 mg | Freq: Once | INTRAVENOUS | Status: AC
  Administered 2018-09-03: 4 mg via INTRAVENOUS
  Filled 2018-09-03: qty 1

## 2018-09-03 NOTE — Progress Notes (Addendum)
Patient Discharge: Disposition: Patient discharged to home. Education: Reviewed medications, follow-up appointments, and discharge instructions, verbalized understanding.  (used interpreter). IV: Discontinued IV before discharge. Telemetry: N/A Transportation: Patient walked out of the unit, refused w/c. Belongings: Patient took all her belongings with her.

## 2018-09-03 NOTE — Progress Notes (Signed)
Admission note:  Arrival Method: Patient arrived from ED. Mental Orientation:  Alert and oriented, spanish speaking. Telemetry: N/A Assessment:  See doc flow sheets.  Skin: Intact. IV: Right AC NS @75  ml/hr. Pain: Denies any currently. Tubes: N/A Safety Measures:  Bed in low position, call bell and phone within reach. Fall Prevention Safety Plan: reviewed the plan, understood and acknowledged. Admission Screening: In process. 6700 Orientation: Patient has been oriented to the unit, staff and to the room.  Used [employer] interpreter.

## 2018-09-03 NOTE — Progress Notes (Signed)
FPTS Progress Note:   Visited with patient as she was signed out to me to have 72moinfant at home and team wasn't sure if she was breastfeeding or not. She is breastfeeding, last fed at 2am. She does not have a pump with her. Someone in the ED told her that she should pump and dump for 24 hours. I reassured her that this is inaccurate (source - Infant Risk, the definitive national database of medications and safety in breastfeeding). Discussed with patient that she can have any team member call me with questions about this. Patient has a bed in 71M - discussed with secretary need for breast pump - Medela pump available on the floor, secretary will call down to sterile processing for pump kit. Instructed sNetwork engineerthat we can store patient's breastmilk in bottles and patient stickers on them either in med fridge or general fridge. Will call RN and update her as well. Thanks to team for help with this, mom was tearful in appreciation.   KRalene Ok MD PGY 3 FM

## 2018-09-03 NOTE — Consult Note (Signed)
Referring Provider: Dr. Manson Passey Primary Care Physician:  Tillman Sers, DO Primary Gastroenterologist: Gentry Fitz  Reason for Consultation:  Epigastric pain; Cholelithiasis  HPI: Loretta [name] is a 32 y.o. female 2 months postpartum (spontaneous vaginal delivery) who came to the hospital with 3 days of epigastric and RUQ pain (worse in epigastric area). Through a medical translator ([employer]) with bedside nurse present as well has been having "strong" sharp pain that has been constant for the past 3 days and is worse with eating. Denies associated nausea/vomiting/fevers. Denies any similar pain while pregnant or after delivery until 3 days ago. U/S showed gallstones and sludge in the GB without cholecystitis. CBD dilated to 9 mm. Fatty liver. Lipase 41, TB 0.3, ALP 107, AST 58, ALT 73. Denies any current abdominal pain. History of GERD on Prevacid as outpt. No history of constipation. Nurse present during my evaluation.  History reviewed. No pertinent past medical history.  History reviewed. No pertinent surgical history.  Prior to Admission medications   Medication Sig Start Date End Date Taking? Authorizing Provider  ferrous fumarate (HEMOCYTE - 106 MG FE) 325 (106 Fe) MG TABS tablet Take 1 tablet (106 mg of iron total) by mouth 2 (two) times daily. Patient not taking: Reported on 07/29/2018 05/16/18   Marylene Land, CNM  lansoprazole (PREVACID) 15 MG capsule Take 15 mg by mouth daily at 12 noon.    [provider]  Prenatal Multivit-Min-Fe-FA (PRENATAL VITAMINS PO) Take by mouth.    [provider]    Scheduled Meds: . heparin  5,000 Units Subcutaneous Q8H   Continuous Infusions: . sodium chloride 75 mL/hr at 09/03/18 0625   PRN Meds:.ketorolac, ondansetron **OR** ondansetron (ZOFRAN) IV, polyethylene glycol  Allergies as of 09/02/2018  . (No Known Allergies)    No family history on file.  Social History   Socioeconomic History  .  Marital status: Married    Spouse name: Not on file  . Number of children: Not on file  . Years of education: Not on file  . Highest education level: Not on file  Occupational History  . Not on file  Social Needs  . Financial resource strain: Not on file  . Food insecurity:    Worry: Not on file    Inability: Not on file  . Transportation needs:    Medical: Not on file    Non-medical: Not on file  Tobacco Use  . Smoking status: Never Smoker  . Smokeless tobacco: Never Used  Substance and Sexual Activity  . Alcohol use: No    Frequency: Never  . Drug use: No  . Sexual activity: Yes    Birth control/protection: None  Lifestyle  . Physical activity:    Days per week: Not on file    Minutes per session: Not on file  . Stress: Not on file  Relationships  . Social connections:    Talks on phone: Not on file    Gets together: Not on file    Attends religious service: Not on file    Active member of club or organization: Not on file    Attends meetings of clubs or organizations: Not on file    Relationship status: Not on file  . Intimate partner violence:    Fear of current or ex partner: Not on file    Emotionally abused: Not on file    Physically abused: Not on file    Forced sexual activity: Not on file  Other Topics Concern  .  Not on file  Social History Narrative  . Not on file    Review of Systems: All negative except as stated above in HPI.  Physical Exam: Vital signs: Vitals:   09/03/18 0600 09/03/18 0832  BP: 120/89 (!) 129/91  Pulse: 75 97  Resp: 15 18  Temp:  97.9 F (36.6 C)  SpO2: 98% 100%     General:   Lethargic, Well-developed, well-nourished, pleasant and cooperative in NAD Head: normocephalic, atraumatic Eyes: anicteric sclera ENT: oropharynx clear Neck: supple, nontender Lungs:  Clear throughout to auscultation.   No wheezes, crackles, or rhonchi. No acute distress. Heart:  Regular rate and rhythm; no murmurs, clicks, rubs,  or  gallops. Abdomen: soft, nontender, nondistended, +BS  Rectal:  Deferred Ext: no edema  GI:  Lab Results: Recent Labs    09/02/18 2134  WBC 10.4  HGB 13.1  HCT 40.9  PLT 425*   BMET Recent Labs    09/02/18 2134  NA 138  K 3.6  CL 105  CO2 24  GLUCOSE 105*  BUN 13  CREATININE 0.65  CALCIUM 9.8   LFT Recent Labs    09/02/18 2134  PROT 8.1  ALBUMIN 4.2  AST 58*  ALT 73*  ALKPHOS 107  BILITOT 0.3   PT/INR No results for input(s): LABPROT, INR in the last 72 hours.   Studies/Results: [place] Abdomen Limited Ruq  Result Date: 09/03/2018 CLINICAL DATA:  Acute onset of right upper quadrant abdominal pain. EXAM: ULTRASOUND ABDOMEN LIMITED RIGHT UPPER QUADRANT COMPARISON:  None. FINDINGS: Gallbladder: Multiple small stones and sludge are seen dependently within the gallbladder. No gallbladder wall thickening is seen. No ultrasonographic Murphy's sign is elicited. No pericholecystic fluid is appreciated. Common bile duct: Diameter: 0.9 cm, raising concern for distal obstruction. Liver: No focal lesion identified. Diffusely increased parenchymal echogenicity and coarsened echotexture, likely reflecting fatty infiltration. Portal vein is patent on color Doppler imaging with normal direction of blood flow towards the liver. IMPRESSION: 1. Dilatation of the common bile duct to 0.9 cm, raising concern for distal obstruction. Would correlate with LFTs. 2. Cholelithiasis, with small stones and sludge dependently within the gallbladder. No evidence of cholecystitis. 3. Fatty infiltration within the liver. Electronically Signed   By: Roanna RaiderJeffery  Chang M.D.   On: 09/03/2018 03:24    Impression/Plan: Epigastric/RUQ pain with minimally elevated LFTs and gallstones/sludge in GB and mildly dilated CBD on U/S. I think she passed a gallstone and doubt she has a choledocholithiasis. MRCP to further evaluate biliary tree. Recheck LFTs today. Surgery consult inpt/outpt. If MRCP negative and LFTs remain  low then can f/u with surgery as outpt but advised team to discuss with surgery prior to discharge. If MRCP shows a CBD stone, then patient has agreed to an ERCP after discussion of risks/benefits with translation by a medical translator as stated above in HPI. Supportive care.    LOS: 0 days   Shirley FriarVincent C Kasin Tonkinson  09/03/2018, 9:04 AM  Questions please call [phone]

## 2018-09-03 NOTE — Progress Notes (Signed)
MRI report reviewed as no stones in CBD so I would recommend calling surgery for lap chole and Intra-Op cholangiogram just to be sure please call us if we could be of any further assistance

## 2018-09-03 NOTE — ED Provider Notes (Signed)
[facility] EMERGENCY DEPARTMENT Provider Note   CSN: [id number] Arrival date & time: 09/02/18  2118     History   Chief Complaint Chief Complaint  Patient presents with  . Abdominal Pain    HPI Loretta [name] is a 32 y.o. female.  The history is provided by the patient and medical records.  Abdominal Pain   Associated symptoms include nausea and vomiting.     32 year old female with history of acid reflux, presenting to the ED for epigastric abdominal pain.  History somewhat limited due to language barrier.  Patient primarily speaks [place].  I have offered formal interpretive services, however she elected family member to interpret.  Patient began having pain Sunday morning, has been intermittent since that time with some episodes of nausea and vomiting.  Got worse last night and unable to get comfortable at this time.  Pain did get worse after eating-- states she ate eggs, beans, cheese, and tortillas.  Pain localized to her epigastrium and a little bit to her right side.  Also has some discomfort in her back.  She denies any chest pain or shortness of breath.  She is 2 months postpartum from spontaneous vaginal delivery without any noted complications.  She has not had any pelvic pain or vaginal discharge.  No medications tried at home prior to arrival.  Does report history of gallstones but no follow-up or recommended surgical intervention for this recently.  History reviewed. No pertinent past medical history.  Patient Active Problem List   Diagnosis Date Noted  . Normal labor and delivery 06/23/2018  . Post-dates pregnancy 06/23/2018  . Anemia affecting pregnancy in third trimester 05/16/2018  . Unwanted fertility 02/12/2018  . Language barrier 02/12/2018  . Gastroesophageal reflux disease 12/27/2017  . Supervision of other normal pregnancy, antepartum 12/07/2017    History reviewed. No pertinent surgical history.   OB History    Gravida  3   Para  3   Term  3   Preterm      AB      Living  3     SAB      TAB      Ectopic      Multiple  0   Live Births  3            Home Medications    Prior to Admission medications   Medication Sig Start Date End Date Taking? Authorizing Provider  ferrous fumarate (HEMOCYTE - 106 MG FE) 325 (106 Fe) MG TABS tablet Take 1 tablet (106 mg of iron total) by mouth 2 (two) times daily. Patient not taking: Reported on 07/29/2018 05/16/18   Starr Lake, CNM  lansoprazole (PREVACID) 15 MG capsule Take 15 mg by mouth daily at 12 noon.    [provider]  Prenatal Multivit-Min-Fe-FA (PRENATAL VITAMINS PO) Take by mouth.    [provider]    Family History No family history on file.  Social History Social History   Tobacco Use  . Smoking status: Never Smoker  . Smokeless tobacco: Never Used  Substance Use Topics  . Alcohol use: No    Frequency: Never  . Drug use: No     Allergies   Patient has no known allergies.   Review of Systems Review of Systems  Gastrointestinal: Positive for abdominal pain, nausea and vomiting.  All other systems reviewed and are negative.    Physical Exam Updated Vital Signs BP 121/70   Pulse 81  Temp 98.4 F (36.9 C) (Oral)   Resp 17   Ht 5' 2.99" (1.6 m)   Wt 61.2 kg   LMP 09/10/2017 (Exact Date)   SpO2 100%   BMI 23.92 kg/m   Physical Exam  Constitutional: She is oriented to person, place, and time. She appears well-developed and well-nourished.  Appears uncomfortable, writhing in bed holding her stomach  HENT:  Head: Normocephalic and atraumatic.  Mouth/Throat: Oropharynx is clear and moist.  Eyes: Pupils are equal, round, and reactive to light. Conjunctivae and EOM are normal.  Neck: Normal range of motion.  Cardiovascular: Normal rate, regular rhythm and normal heart sounds.  Pulmonary/Chest: Effort normal and breath sounds normal. No stridor. No respiratory distress.    Abdominal: Soft. Bowel sounds are normal. There is tenderness in the right upper quadrant and epigastric area. There is no rebound.  Musculoskeletal: Normal range of motion.  Neurological: She is alert and oriented to person, place, and time.  Skin: Skin is warm and dry.  Psychiatric: She has a normal mood and affect.  Nursing note and vitals reviewed.    ED Treatments / Results  Labs (all labs ordered are listed, but only abnormal results are displayed) Labs Reviewed  COMPREHENSIVE METABOLIC PANEL - Abnormal; Notable for the following components:      Result Value   Glucose, Bld 105 (*)    AST 58 (*)    ALT 73 (*)    All other components within normal limits  CBC - Abnormal; Notable for the following components:   Platelets 425 (*)    All other components within normal limits  URINALYSIS, ROUTINE W REFLEX MICROSCOPIC - Abnormal; Notable for the following components:   APPearance HAZY (*)    Hgb urine dipstick SMALL (*)    Leukocytes, UA MODERATE (*)    Bacteria, UA RARE (*)    All other components within normal limits  LIPASE, BLOOD  I-STAT BETA HCG BLOOD, ED ([facility], [facility], [facility] ONLY)    EKG None  Radiology Us Abdomen Limited Ruq  Result Date: 09/03/2018 CLINICAL DATA:  Acute onset of right upper quadrant abdominal pain. EXAM: ULTRASOUND ABDOMEN LIMITED RIGHT UPPER QUADRANT COMPARISON:  None. FINDINGS: Gallbladder: Multiple small stones and sludge are seen dependently within the gallbladder. No gallbladder wall thickening is seen. No ultrasonographic Murphy's sign is elicited. No pericholecystic fluid is appreciated. Common bile duct: Diameter: 0.9 cm, raising concern for distal obstruction. Liver: No focal lesion identified. Diffusely increased parenchymal echogenicity and coarsened echotexture, likely reflecting fatty infiltration. Portal vein is patent on color Doppler imaging with normal direction of blood flow towards the liver. IMPRESSION: 1. Dilatation of the common bile duct  to 0.9 cm, raising concern for distal obstruction. Would correlate with LFTs. 2. Cholelithiasis, with small stones and sludge dependently within the gallbladder. No evidence of cholecystitis. 3. Fatty infiltration within the liver. Electronically Signed   By: Jeffery  Chang M.D.   On: 09/03/2018 03:24    Procedures Procedures (including critical care time)  Medications Ordered in ED Medications  morphine 4 MG/ML injection 4 mg (4 mg Intravenous Given 09/03/18 0228)  ondansetron (ZOFRAN) injection 4 mg (4 mg Intravenous Given 09/03/18 0227)  sodium chloride 0.9 % bolus 1,000 mL (1,000 mLs Intravenous New Bag/Given 09/03/18 0227)     Initial Impression / Assessment and Plan / ED Course  I have reviewed the triage vital signs and the nursing notes.  Pertinent labs & imaging results that were available during my care of the patient   were reviewed by me and considered in my medical decision making (see chart for details).  32 year old female here with epigastric abdominal pain.  Seems to have been ongoing for the past 2 days.  She is 2 months postpartum SVD without any noted complications.  She is afebrile and nontoxic in appearance but does appear significantly uncomfortable.  Tenderness in the epigastrium and mildly to her right upper quadrant.  Labs reviewed, does have some elevation of LFTs, bili and alk phos are normal.  Right upper quadrant ultrasound obtained, does have stones and sludge in the gallbladder but no findings of acute cholecystitis.  CBD is dilated up to about 1 cm, concerning for distal obstruction.  Clinical picture is concerning for choledocholithiasis.  Discussed with GI, Dr. Michail Sermon-- team will see her in the morning.  Will admit to medicine.  Pain and nausea controlled after morphine/zofran.  Discussed with IM teaching service-- they will admit for ongoing care.  Final Clinical Impressions(s) / ED Diagnoses   Final diagnoses:  RUQ pain  Choledocholithiasis    ED  Discharge Orders    None       Larene Pickett, PA-C 09/03/18 0452    Orpah Greek, MD 09/03/18 [phone]

## 2018-09-03 NOTE — ED Notes (Signed)
Patient transported to Ultrasound 

## 2018-09-03 NOTE — Discharge Summary (Addendum)
Family Medicine Teaching [facility] Discharge Summary  Patient name: Loretta [name] Medical record number: [id number] Date of birth: 19-Mar-1986 Age: 32 y.o. Gender: female Date of Admission: 09/02/2018  Date of Discharge: 09/03/18  Admitting Physician: Westley Chandlerarina M Brown, MD  Primary Care Provider: Tillman Sersiccio, Angela C, DO Consultants: GI  Indication for Hospitalization: Choledocholithiasis   Discharge Diagnoses/Problem List:  Principal Problem:   Choledocholithiasis Active Problems:   Gastroesophageal reflux disease   RUQ pain  Disposition: Discharge to home  Discharge Condition: Good  Discharge Exam:  BP (!) 129/91 (BP Location: Left Arm)   Pulse 97   Temp 97.9 F (36.6 C) (Oral)   Resp 18   Ht 5' 2.99" (1.6 m)   Wt 61.2 kg   LMP 09/10/2017 (Exact Date)   SpO2 100%   BMI 23.92 kg/m   Physical Exam:  Gen: NAD, alert, non-toxic, well-appearing, sitting comfortably  Skin: Warm and dry. No obvious rashes, lesions, or trauma. HEENT: NCAT No conjunctival pallor or injection. No scleral icterus or injection.  MMM.  CV: RRR.  Normal S1-S2.   Resp: CTAB. No increased WOB YNW:GNFAAbd:NTND.  Positive bowel sounds. Extremities: Moves all extremities spontaneously  Neuro: CN II-XII grossly intact. No FNDs.   Brief Hospital Course:  Loretta [name] is a 32 y.o. female with past medical history significant for GERD, who presented with RUQ pain and found to have choledocholithiasis.  Initial work up with significant for U/S dilatation of CBD 0.9cm, cholelithiasis with small stones and sludge. No evidence of cholecystitis. Patient's AST 58 and ALT 73 were mildly elevated. After admission, her pain improved. GI was consulted and they recommended MRCP, which showed no obstruction. She was discharged with return precautions and follow up with outpatient surgery for non-emergent cholecystectomy (and intra-op cholangiogram, per GI recs)  Issues for Follow Up:   1. Referral to outpatient surgery  2. Return of RUQ symptoms 3. Follow up CMP (discharged patient prior to labs were resulted).   4. Urinary symptoms (UA with bacteria and leukocytes). Pt asymptomatic during admission.  Significant Procedures: MRCP  Significant Labs and Imaging:  Recent Labs  Lab 09/02/18 2134  WBC 10.4  HGB 13.1  HCT 40.9  PLT 425*   Recent Labs  Lab 09/02/18 2134  NA 138  K 3.6  CL 105  CO2 24  GLUCOSE 105*  BUN 13  CREATININE 0.65  CALCIUM 9.8  ALKPHOS 107  AST 58*  ALT 73*  ALBUMIN 4.2    Mr Abdomen Mrcp W Wo Contast  Result Date: 09/03/2018 CLINICAL DATA:  Cholelithiasis with abnormal liver function tests. Common bile duct dilatation. EXAM: MRI ABDOMEN WITHOUT AND WITH CONTRAST (INCLUDING MRCP) TECHNIQUE: Multiplanar multisequence MR imaging of the abdomen was performed both before and after the administration of intravenous contrast. Heavily T2-weighted images of the biliary and pancreatic ducts were obtained, and three-dimensional MRCP images were rendered by post processing. CONTRAST:  6 cc Gadavist. COMPARISON:  Ultrasound exam 09/03/2018. FINDINGS: Of note, the patient was agitated and moving her arms during the study. The intravenous gadolinium extravasated during power injection resulting in no substantial intravascular enhancement on postcontrast sequences. Lower chest: Unremarkable. Hepatobiliary: No focal abnormalities identified within the liver parenchyma. Multiple layering tiny gallstones evident, measuring up to about 3 mm diameter. Common bile duct measures upper normal diameter at 6-7 mm. Common bile duct in the head of the pancreas is normal at 4 mm diameter. No evidence for choledocholithiasis Pancreas: No focal mass lesion. No dilatation  of the main duct. No intraparenchymal cyst. No peripancreatic edema. Spleen:  No splenomegaly. No focal mass lesion. Adrenals/Urinary Tract: No adrenal nodule or mass. Kidneys unremarkable. Stomach/Bowel:  Stomach is nondistended. No gastric wall thickening. No evidence of outlet obstruction. Duodenum is normally positioned as is the ligament of Treitz. No small bowel or colonic dilatation within the visualized abdomen. Vascular/Lymphatic: No abdominal aortic aneurysm. There is no gastrohepatic or hepatoduodenal ligament lymphadenopathy. No intraperitoneal or retroperitoneal lymphadenopathy. Other:  No intraperitoneal free fluid. Musculoskeletal: No abnormal marrow signal within the visualized bony anatomy. IMPRESSION: 1. Cholelithiasis without choledocholithiasis. 2. Common duct diameter upper normal at 6-7 mm diameter with common bile duct measuring normal 4 mm diameter. Electronically Signed   By: Kennith Center M.D.   On: 09/03/2018 12:59   US Abdomen Limited Ruq  Result Date: 09/03/2018 CLINICAL DATA:  Acute onset of right upper quadrant abdominal pain. EXAM: ULTRASOUND ABDOMEN LIMITED RIGHT UPPER QUADRANT COMPARISON:  None. FINDINGS: Gallbladder: Multiple small stones and sludge are seen dependently within the gallbladder. No gallbladder wall thickening is seen. No ultrasonographic Murphy's sign is elicited. No pericholecystic fluid is appreciated. Common bile duct: Diameter: 0.9 cm, raising concern for distal obstruction. Liver: No focal lesion identified. Diffusely increased parenchymal echogenicity and coarsened echotexture, likely reflecting fatty infiltration. Portal vein is patent on color Doppler imaging with normal direction of blood flow towards the liver. IMPRESSION: 1. Dilatation of the common bile duct to 0.9 cm, raising concern for distal obstruction. Would correlate with LFTs. 2. Cholelithiasis, with small stones and sludge dependently within the gallbladder. No evidence of cholecystitis. 3. Fatty infiltration within the liver. Electronically Signed   By: Roanna Raider M.D.   On: 09/03/2018 03:24    Discharge Medications:  Allergies as of 09/03/2018   No Known Allergies     Medication  List    TAKE these medications   ferrous fumarate 325 (106 Fe) MG Tabs tablet Commonly known as:  HEMOCYTE - 106 mg FE Take 1 tablet (106 mg of iron total) by mouth 2 (two) times daily.   lansoprazole 15 MG capsule Commonly known as:  PREVACID Take 15 mg by mouth daily at 12 noon.   PRENATAL VITAMINS PO Take by mouth.       Discharge Instructions: Please refer to Patient Instructions section of EMR for full details.  Patient was counseled important signs and symptoms that should prompt return to medical care, changes in medications, dietary instructions, activity restrictions, and follow up appointments.   Follow-Up Appointments: Future Appointments  Date Time Provider Department Center  09/05/2018 10:10 AM Tillman Sers, DO [facility]     Melene Plan, MD 09/03/2018, 1:30 PM PGY-1, [facility] Family Medicine

## 2018-09-03 NOTE — H&P (Addendum)
Family Medicine Teaching [facility] Admission History and Physical Service Pager: [phone]  Patient name: Loretta [name] Medical record number: [id number] Date of birth: Feb 17, 1986 Age: 32 y.o. Gender: female  Primary Care Provider: Tillman Sers, DO Consultants: Gastroenterology Code Status: FULL   Chief Complaint: RUQ pain   Assessment and Plan: Loretta [name] is a 32 y.o. female presenting with abdominal pain x2 days. PMH is significant for GERD and h/o gallstones.   Abdominal pain 2/2 choledocholithiasis: Patient presented with 2 days of progressively worse worsening epigastric and right upper quadrant abdominal pain. On ultrasound in the ED she was found to have dilatation of the common bile duct to 0.9 cm, raising concern for distal obstruction.  Imaging also revealed small stones and sludge dependently within the gallbladder. GI consulted in the ED. the patient was given morphine and Zofran for pain and nausea control.  ALT and AST slightly elevated to 58 and 73 respectively. Platelets 425, but CBC otherwise within normal limits.  The cause of the abdominal pain is most likely choledocholithiasis due to findings on imaging.  She does have a history of GERD, for which the patient takes Prevacid at home, which might be adding to the patient's epigastric pain.  Lipase is within normal limits, ruling out pancreatitis.  Bowel sounds are present and normal on physical exam, ruling out ileus. Patient reports normal bowel function ruling out an obstruction as cause for the pain.  Of note, the patient is about 2 months postpartum from spontaneous vaginal delivery without any noted complications.  She denies pelvic pain, vaginal bleeding, or vaginal discharge.  GI to evaluate this morning to determine further plan.  -Admit to MedSurg, attending Dr. Manson Passey -f/u GI recs -NPO on admission pending recommendations -IV normal saline at 75 cc/h x12 hours while  patient is NPO -Zofran 4 mg IV every 6 PRN nausea   -Toradol 30 mg q6 hours PRN pain -heparin in case requiring surgical intervention today  -K pad as needed discomfort -vital signs per protocol -Up with assistance -CBC/BMP  -vitals per unit routine  GERD: Patient has a history of such, which she controls at home with Prevacid. -Protonix while inpatient  FEN/GI: NPO, IV normal saline at 75 cc/h, MiraLAX as needed  Prophylaxis: heparin  Disposition: Admit to med-surg, attending Dr. Manson Passey   History of Present Illness:  Loretta [name] is a 32 y.o. female presenting with 48 hours of intermittent abdominal pain, accompanied by nausea and 3 episodes of vomiting.  The patient is Spanish-speaking and a phoned in Spanish interpreter was used for this interview.  Last night, the patient's pain worsened.  The pain worsened with eating her dinner consisting of eggs, beans, cheese, and tortillas.  At home she reports being unable to get comfortable at this time, so she presented to the emergency department around 9 PM.  She did not try any medications at home to relieve the pain prior to her arrival in the ED.  The worsening pain localized to her epigastric region and a little to the right upper quadrant, as well as some radiation to her back.  On our interview she reports "chest pain" at the epigastric region and shortness of breath.  She denies fever, chills, urinary symptoms, and changes to bowel function.  She does have a history of gallstones, but there was no follow-up or recommended surgical intervention.   Of note, the patient is about 2 months postpartum from spontaneous vaginal delivery without any  noted complications.  She is very tearful during our interview and exam in the emergency department because she was recently informed she might need surgery to fix this problem and is concerned because she has 3 children at home.  As well, her father had previously provided interpretation  for her in the emergency department, but he was out of the room at the time of our interview.  Review Of Systems: Per HPI with the following additions:   Review of Systems  Constitutional: Negative for chills, fever and malaise/fatigue.  Respiratory: Negative for cough and shortness of breath.   Cardiovascular: Negative for chest pain and leg swelling.  Gastrointestinal: Positive for abdominal pain, nausea and vomiting. Negative for blood in stool, constipation and diarrhea.  Musculoskeletal: Negative for myalgias.   Patient Active Problem List   Diagnosis Date Noted  . Choledocholithiasis 09/03/2018  . RUQ pain   . Normal labor and delivery 06/23/2018  . Post-dates pregnancy 06/23/2018  . Anemia affecting pregnancy in third trimester 05/16/2018  . Unwanted fertility 02/12/2018  . Language barrier 02/12/2018  . Gastroesophageal reflux disease 12/27/2017  . Supervision of other normal pregnancy, antepartum 12/07/2017   Past Medical History: History reviewed. No pertinent past medical history.  Past Surgical History: History reviewed. No pertinent surgical history.  Social History: Social History   Tobacco Use  . Smoking status: Never Smoker  . Smokeless tobacco: Never Used  Substance Use Topics  . Alcohol use: No    Frequency: Never  . Drug use: No   Additional social history: Lives at home with her husband and 3 children, denies tobacco use, no alcohol or illicit drug use.  Please also refer to relevant sections of EMR.  Family History: Mother - none Father- none  Allergies and Medications: No Known Allergies No current facility-administered medications on file prior to encounter.    Current Outpatient Medications on File Prior to Encounter  Medication Sig Dispense Refill  . ferrous fumarate (HEMOCYTE - 106 MG FE) 325 (106 Fe) MG TABS tablet Take 1 tablet (106 mg of iron total) by mouth 2 (two) times daily. (Patient not taking: Reported on 07/29/2018) 60 each 0  .  lansoprazole (PREVACID) 15 MG capsule Take 15 mg by mouth daily at 12 noon.    . Prenatal Multivit-Min-Fe-FA (PRENATAL VITAMINS PO) Take by mouth.      Objective: BP 125/87   Pulse (!) 119   Temp 98.4 F (36.9 C) (Oral)   Resp 15   Ht 5' 2.99" (1.6 m)   Wt 61.2 kg   LMP 09/10/2017 (Exact Date)   SpO2 100%   BMI 23.92 kg/m   Physical Exam  Constitutional: She is oriented to person, place, and time. She appears well-developed and well-nourished. She does not appear ill.  Tearful, pleasant  Cardiovascular: Normal rate, regular rhythm and normal heart sounds.  No murmur heard. Pulmonary/Chest: Effort normal and breath sounds normal. No respiratory distress.  Abdominal: Soft. Normal appearance and bowel sounds are normal. She exhibits no ascites and no pulsatile midline mass. There is tenderness (To palpation of epigastric region). There is negative Murphy's sign. No hernia.  Neurological: She is alert and oriented to person, place, and time.  Skin: Skin is warm and dry. She is not diaphoretic.   Labs and Imaging: CBC BMET  Recent Labs  Lab 09/02/18 2134  WBC 10.4  HGB 13.1  HCT 40.9  PLT 425*   Recent Labs  Lab 09/02/18 2134  NA 138  K 3.6  CL 105  CO2 24  BUN 13  CREATININE 0.65  GLUCOSE 105*  CALCIUM 9.8     Urinalysis    Component Value Date/Time   COLORURINE YELLOW 09/03/2018 0228   APPEARANCEUR HAZY (A) 09/03/2018 0228   LABSPEC 1.017 09/03/2018 0228   PHURINE 6.0 09/03/2018 0228   GLUCOSEU NEGATIVE 09/03/2018 0228   HGBUR SMALL (A) 09/03/2018 0228   BILIRUBINUR NEGATIVE 09/03/2018 0228   KETONESUR NEGATIVE 09/03/2018 0228   PROTEINUR NEGATIVE 09/03/2018 0228   UROBILINOGEN 0.2 12/07/2017 0858   NITRITE NEGATIVE 09/03/2018 0228   LEUKOCYTESUR MODERATE (A) 09/03/2018 0228   Lipase: 41 wnl b-hCG: <0.05 AST: 58 H ALT: 73 H  [place] Abdomen Limited Ruq  Result Date: 09/03/2018 CLINICAL DATA:  Acute onset of right upper quadrant abdominal pain. EXAM:  ULTRASOUND ABDOMEN LIMITED RIGHT UPPER QUADRANT COMPARISON:  None. FINDINGS: Gallbladder: Multiple small stones and sludge are seen dependently within the gallbladder. No gallbladder wall thickening is seen. No ultrasonographic Murphy's sign is elicited. No pericholecystic fluid is appreciated. Common bile duct: Diameter: 0.9 cm, raising concern for distal obstruction. Liver: No focal lesion identified. Diffusely increased parenchymal echogenicity and coarsened echotexture, likely reflecting fatty infiltration. Portal vein is patent on color Doppler imaging with normal direction of blood flow towards the liver. IMPRESSION: 1. Dilatation of the common bile duct to 0.9 cm, raising concern for distal obstruction. Would correlate with LFTs. 2. Cholelithiasis, with small stones and sludge dependently within the gallbladder. No evidence of cholecystitis. 3. Fatty infiltration within the liver. Electronically Signed   By: Roanna RaiderJeffery  Chang M.D.   On: 09/03/2018 03:24    Dollene ClevelandAnderson, Hannah C, DO 09/03/2018, 5:54 AM PGY-1, [facility] FPTS Intern pager: [phone], text pages welcome  I have seen and evaluated the above patient with Dr. Dareen PianoAnderson and agree with her documentation.  I am including my edits in blue.   Freddrick MarchYashika Redding Cloe MD [facility] PGY3

## 2018-09-05 ENCOUNTER — Encounter: Payer: Self-pay | Admitting: Family Medicine

## 2018-09-05 ENCOUNTER — Ambulatory Visit (INDEPENDENT_AMBULATORY_CARE_PROVIDER_SITE_OTHER): Payer: Medicaid Other | Admitting: Family Medicine

## 2018-09-05 VITALS — BP 102/62 | HR 93 | Temp 98.8°F | Ht 62.0 in | Wt 145.0 lb

## 2018-09-05 DIAGNOSIS — Z09 Encounter for follow-up examination after completed treatment for conditions other than malignant neoplasm: Secondary | ICD-10-CM

## 2018-09-05 DIAGNOSIS — R1011 Right upper quadrant pain: Secondary | ICD-10-CM | POA: Diagnosis not present

## 2018-09-05 DIAGNOSIS — K8050 Calculus of bile duct without cholangitis or cholecystitis without obstruction: Secondary | ICD-10-CM | POA: Diagnosis not present

## 2018-09-05 NOTE — Assessment & Plan Note (Signed)
  Resolved but needs lap chole w/ surgery. Patient had not been referred at time of discharge. I put in referral for her and explained she will be called to make an appointment. Discussed reasons to return to ED if pain returns, she develops fevers. She is agreeable to this plan. She knows to call us if she has any questions or needs to be seen again.

## 2018-09-05 NOTE — Progress Notes (Signed)
    Subjective:    Patient ID: Edyth GunnelsZoila Esmeralda Avalos Bermudez, female    DOB: 06-Sep-1986, 32 y.o.   MRN: [id number]   CC: hosp fu abdominal pain  HPI: Patient is a 32 year old who recently had her 3rd child and was admitted to the hospital with RUQ pain concerning for cholecystitis. She had a dilated CBD on RUQ US imaging as well as elevated LFTs. MRCP was negative for gallstone in CBD but demonstrated cholelithiasis. She was discharged and told to schedule surgery follow up.   She is following up today and reports her pain is controlled without medication. She is taking simethicone for gas pains but no other medications. She feels well. She has not heard from surgery or had an appointment scheduled with them. She is feeling well. She is happy to be home with her children. She does not have questions or concerns at this time.  Smoking status reviewed- non-smoker  Review of Systems- no fevers, chills, nausea, vomiting, dysuria, diarrhea, constipation   Objective:  BP 102/62   Pulse 93   Temp 98.8 F (37.1 C) (Oral)   Ht 5\' 2"  (1.575 m)   Wt 145 lb (65.8 kg)   LMP 09/10/2017 (Exact Date)   BMI 26.52 kg/m  Vitals and nursing note reviewed  General: well appearing, in no acute distress HEENT: normocephalic, MMM Cardiac: regular rate Respiratory: no increased work of breathing Abdomen: soft, nontender, nondistended Neuro: alert and oriented, no focal deficits   Assessment & Plan:    RUQ pain  Resolved but needs lap chole w/ surgery. Patient had not been referred at time of discharge. I put in referral for her and explained she will be called to make an appointment. Discussed reasons to return to ED if pain returns, she develops fevers. She is agreeable to this plan. She knows to call us if she has any questions or needs to be seen again.     Return if symptoms worsen or fail to improve.   Dolores PattyAngela Earl Zellmer, DO Family Medicine Resident PGY-3

## 2018-09-05 NOTE — Patient Instructions (Signed)
  Te he referido a ver ciruga. se le llamar para programar una cita. si no tiSurveyor, miningene noticias de nosotros dentro de Elisabeth Mostuna semana, llame a Rolm Galanuestra oficina al [phone] para verificar su referencia. si tiene un retorno del dolor antes de ver la [place], vaya al ED.   Dolores PattyAngela Angeligue Bowne, DO PGY-3, [facility] 09/05/2018 10:00 AM

## 2018-11-06 ENCOUNTER — Ambulatory Visit: Payer: Medicaid Other | Admitting: Family Medicine

## 2018-11-06 ENCOUNTER — Other Ambulatory Visit (HOSPITAL_COMMUNITY)
Admission: RE | Admit: 2018-11-06 | Discharge: 2018-11-06 | Disposition: A | Discharge status: Home / Self Care | Payer: Medicaid Other | Source: Ambulatory Visit | Attending: Family Medicine | Admitting: Family Medicine

## 2018-11-06 VITALS — BP 110/70 | HR 87 | Temp 99.2°F | Wt 145.0 lb

## 2018-11-06 DIAGNOSIS — Z3202 Encounter for pregnancy test, result negative: Secondary | ICD-10-CM

## 2018-11-06 DIAGNOSIS — Z23 Encounter for immunization: Secondary | ICD-10-CM

## 2018-11-06 DIAGNOSIS — R4589 Other symptoms and signs involving emotional state: Secondary | ICD-10-CM

## 2018-11-06 DIAGNOSIS — N898 Other specified noninflammatory disorders of vagina: Secondary | ICD-10-CM

## 2018-11-06 DIAGNOSIS — R102 Pelvic and perineal pain: Secondary | ICD-10-CM | POA: Insufficient documentation

## 2018-11-06 DIAGNOSIS — R1011 Right upper quadrant pain: Secondary | ICD-10-CM | POA: Insufficient documentation

## 2018-11-06 DIAGNOSIS — F329 Major depressive disorder, single episode, unspecified: Secondary | ICD-10-CM | POA: Diagnosis not present

## 2018-11-06 DIAGNOSIS — M549 Dorsalgia, unspecified: Secondary | ICD-10-CM | POA: Insufficient documentation

## 2018-11-06 LAB — POCT URINE PREGNANCY: Preg Test, Ur: NEGATIVE

## 2018-11-06 LAB — POCT URINALYSIS DIP (MANUAL ENTRY)
BILIRUBIN UA: NEGATIVE
GLUCOSE UA: NEGATIVE mg/dL
Ketones, POC UA: NEGATIVE mg/dL
NITRITE UA: NEGATIVE
Protein Ur, POC: NEGATIVE mg/dL
RBC UA: NEGATIVE
SPEC GRAV UA: 1.01 (ref 1.010–1.025)
Urobilinogen, UA: 0.2 E.U./dL
pH, UA: 7 (ref 5.0–8.0)

## 2018-11-06 LAB — POCT WET PREP (WET MOUNT)
Clue Cells Wet Prep Whiff POC: NEGATIVE
TRICHOMONAS WET PREP HPF POC: ABSENT

## 2018-11-06 LAB — POCT UA - MICROSCOPIC ONLY

## 2018-11-06 MED ORDER — SERTRALINE HCL 25 MG PO TABS: 25.0000 mg | ORAL_TABLET | Freq: Every day | ORAL | 0 refills | Status: DC

## 2018-11-06 NOTE — Progress Notes (Signed)
    Subjective:    Patient ID: Loretta [name], female    DOB: 03/26/1986, 33 y.o.   MRN: [id number]   CC: abdominal and back pain  HPI: patient reports since giving birth vaginally in September she has had abdominal and back pain daily. She localizes the pain to her suprapubic region and lumbar region. The pains always come together. They are worse at night. She reports pain is improved with tylenol or motrin but once they wear off pain returns. No urinary symptoms including burning or urgency. No constipation, daily BM last had one this morning. No fevers or chills, no nausea or vomiting.  Also reports hair falling out. She has nexplanon in place for birth control. She is breastfeeding her baby.  She reports mood has been low because of not feeling well and family issues. She states she is arguing with spouse. She reports she would want to start medication for mood. She denies SI/HI.   Smoking status reviewed- non-smoker  Review of Systems- see HPI   Objective:  BP 110/70   Pulse 87   Temp 99.2 F (37.3 C) (Oral)   Wt 145 lb (65.8 kg)   SpO2 98%   BMI 26.52 kg/m  Vitals and nursing note reviewed  General: well nourished, in no acute distress HEENT: normocephalic, MMM Cardiac: RRR, clear S1 and S2, no murmurs, rubs, or gallops Respiratory: clear to auscultation bilaterally, no increased work of breathing Abdomen: soft, non-distended, +BS. Tender to palpation in suprapubic region. No guarding or rebound tenderness.  GU: normal external female genitalia, moderate amount of white discharge present, cervix pink without lesion. No adnexal masses. Uterus size appropriate. Back: tender to palpation of lumbar spine along midline and of lumbar musculature Skin: warm and dry, no rashes noted Neuro: alert and oriented, no focal deficits   Assessment & Plan:    Depressed mood  Will start zoloft 25 mg daily, follow up 2 weeks. This is safe to take while breastfeeding.    Musculoskeletal back pain  Tender to palpation of low back, likely from lifting 61 month old, no bladder/bowel incontinence, paresthesias. Advised tylenol or ibuprofen, heat pad.   Suprapubic pain  UA w/ leuks but no urinary symptoms, will send for cx. Possibly abdominal wall musculature tenderness. Will follow up cx results  RUQ pain  Vaginal discharge, wet prep negative, gc/chlamydia sent to lab.     Return in about 2 weeks (around 11/20/2018).   Dolores Patty, DO Family Medicine Resident PGY-3

## 2018-11-06 NOTE — Patient Instructions (Addendum)
  Please use heating pad on back/stomach Continue tylenol and/or ibuprofen for pain  I will send you mychart message with results  Follow up 2 weeks with me to discuss mood  If you have questions or concerns please do not hesitate to call at [phone].  Dolores Patty, DO PGY-3, [facility] 11/06/2018 11:48 AM

## 2018-11-06 NOTE — Assessment & Plan Note (Signed)
  Will start zoloft 25 mg daily, follow up 2 weeks. This is safe to take while breastfeeding.

## 2018-11-06 NOTE — Assessment & Plan Note (Signed)
  UA w/ leuks but no urinary symptoms, will send for cx. Possibly abdominal wall musculature tenderness. Will follow up cx results

## 2018-11-06 NOTE — Assessment & Plan Note (Signed)
  Vaginal discharge, wet prep negative, gc/chlamydia sent to lab.

## 2018-11-06 NOTE — Assessment & Plan Note (Signed)
  Tender to palpation of low back, likely from lifting 27 month old, no bladder/bowel incontinence, paresthesias. Advised tylenol or ibuprofen, heat pad.

## 2018-11-07 LAB — CERVICOVAGINAL ANCILLARY ONLY
CHLAMYDIA, DNA PROBE: NEGATIVE
Neisseria Gonorrhea: NEGATIVE

## 2018-11-08 NOTE — Progress Notes (Signed)
  Patient asked me to convey results to her through mychart as she does not want information going to her spouse (phone number listed). It does not appear mychart is set up for her. If she calls [place] to inquire (I told her results would be back Friday) please let her know STD testing negative, urine culture pending. I do not want to call the number provided and reach her husband.

## 2018-11-10 LAB — URINE CULTURE: ORGANISM ID, BACTERIA: NO GROWTH

## 2018-11-20 ENCOUNTER — Ambulatory Visit: Payer: Medicaid Other | Admitting: Family Medicine

## 2018-11-20 DIAGNOSIS — K8020 Calculus of gallbladder without cholecystitis without obstruction: Secondary | ICD-10-CM | POA: Diagnosis not present

## 2019-03-06 ENCOUNTER — Encounter: Payer: Self-pay | Admitting: *Deleted

## 2019-03-19 DIAGNOSIS — Z03818 Encounter for observation for suspected exposure to other biological agents ruled out: Secondary | ICD-10-CM | POA: Diagnosis not present

## 2019-09-24 ENCOUNTER — Other Ambulatory Visit (HOSPITAL_COMMUNITY)
Admission: RE | Admit: 2019-09-24 | Discharge: 2019-09-24 | Disposition: A | Discharge status: Home / Self Care | Payer: Medicaid Other | Source: Ambulatory Visit | Attending: Family Medicine | Admitting: Family Medicine

## 2019-09-24 ENCOUNTER — Other Ambulatory Visit: Payer: Self-pay

## 2019-09-24 ENCOUNTER — Ambulatory Visit: Payer: Medicaid Other | Admitting: Family Medicine

## 2019-09-24 ENCOUNTER — Encounter: Payer: Self-pay | Admitting: Family Medicine

## 2019-09-24 VITALS — BP 102/64 | HR 134 | Ht 62.0 in | Wt 157.4 lb

## 2019-09-24 DIAGNOSIS — R102 Pelvic and perineal pain: Secondary | ICD-10-CM

## 2019-09-24 DIAGNOSIS — Z23 Encounter for immunization: Secondary | ICD-10-CM

## 2019-09-24 DIAGNOSIS — Z124 Encounter for screening for malignant neoplasm of cervix: Secondary | ICD-10-CM

## 2019-09-24 DIAGNOSIS — Z0000 Encounter for general adult medical examination without abnormal findings: Secondary | ICD-10-CM

## 2019-09-24 LAB — POCT WET PREP (WET MOUNT)
Clue Cells Wet Prep Whiff POC: NEGATIVE
Trichomonas Wet Prep HPF POC: ABSENT

## 2019-09-24 LAB — POCT URINALYSIS DIPSTICK
Bilirubin, UA: NEGATIVE
Glucose, UA: NEGATIVE
Ketones, UA: NEGATIVE
Leukocytes, UA: NEGATIVE
Nitrite, UA: NEGATIVE
Protein, UA: NEGATIVE
Spec Grav, UA: 1.01 (ref 1.010–1.025)
Urobilinogen, UA: 0.2 E.U./dL
pH, UA: 7 (ref 5.0–8.0)

## 2019-09-24 NOTE — Patient Instructions (Signed)
Infeccin urinaria en los adultos Urinary Tract Infection, Adult Una infeccin urinaria (IU) puede ocurrir en Corporate treasurercualquier lugar de las vas Holland Patenturinarias. Las vas urinarias incluyen lo siguiente:  Los riones.  Los urteres.  La vejiga.  La uretra. Estos rganos fabrican, almacenan y eliminan el pis (orina) del cuerpo. Cules son las causas? La causa es la presencia de grmenes (bacterias) en la zona genital. Estos grmenes proliferan y causan hinchazn (inflamacin) de las vas urinarias. Qu incrementa el riesgo? Es ms probable que contraiga esta afeccin si:  Tiene colocado un tubo delgado y pequeo (catter) para drenar el pis.  Nopuede controlar la evacuacin de pis ni de materia fecal (incontinencia).  Es Nurse, learning disabilitymujer y, adems: ? [place] estos mtodos para Location managerevitar el embarazo: ? Un medicamento que [place] Incmata los espermatozoides (espermicida). ? Un dispositivo que impide el paso de los espermatozoides (diafragma). ? Tieneniveles bajos de una hormona femenina (estrgeno). ? Est embarazada.  Tiene genes que WESCO Internationalaumentan su riesgo.  Es sexualmente activa.  Toma antibiticos.  Tiene dificultad para orinar debido a: ? Su prstata es ms grande de lo normal, si usted es hombre. ? Obstruccin en la parte del cuerpo que drena el pis de la vejiga (uretra). ? Clculo renal. ? Untrastorno nervioso que afecta la vejiga (vejiga neurgena). ? No bebe una cantidad suficiente de lquido. ? No hace pis con la frecuencia suficiente.  Tiene otras afecciones, como: ? Diabetes. ? Un sistema que combate las enfermedades (sistema inmunitario) debilitado. ? Anemia drepanoctica. ? Gota. ? Lesin en la columna vertebral. Cules son los signos o los sntomas? Los sntomas de esta afeccin incluyen:  Necesidad inmediata (urgente) de hacer pis.  Hacer pis con frecuencia.  Hacer poca cantidad de pis con mucha frecuencia.  Dolor o ardor al American Standard Companieshacer pis.  Sangre en el pis.  Pis que huele mal o anormal.   Dificultad para hacer pis.  Pis turbio.  Lquido que sale de la vagina, si es La Grangemujer.  Dolor en la barriga o en la parte baja de la espalda. Otros sntomas pueden incluir los siguientes:  Vmitos.  No sentir deseos de comer.  Sentirse confundido (confuso).  Sentirse cansado y malhumorado (irritable).  Grant RutsFiebre.  Materia fecal lquida (diarrea). Cmo se trata? El tratamiento de esta afeccin puede incluir:  Antibiticos.  Otros medicamentos.  Beber una cantidad suficiente de agua. Sigue estas instrucciones en tu casa:  Medicamentos  Baxter Internationalome los medicamentos de venta libre y los recetados solamente como se lo haya indicado el mdico.  Si le recetaron un antibitico, tmelo como se lo haya indicado el mdico. No deje de tomarlo aunque comience a sentirse mejor. Indicaciones generales  Asegrese de hacer lo siguiente: ? Haga pis hasta que la vejiga quede vaca. ? Nocontenga el pis durante mucho tiempo. ? Vace la vejiga despus de Management consultanttener relaciones sexuales. ? Lmpiese de adelante hacia atrs despus de defecar, si es mujer. Use cada trozo de papel higinico solo una vez cuando se limpie.  Beba suficiente lquido como para Pharmacologistmantener la orina de color amarillo plido.  Concurra a todas las visitas de [address]seguimiento como se lo haya indicado el mdico. Esto es importante. Comunquese con un mdico si:  No mejora despus de 1 o 2das de tratamiento.  Los sntomas desaparecen y Stage managerluego reaparecen. Solicite ayuda inmediatamente si:  Tiene un dolor muy intenso en la espalda.  Tiene dolor muy intenso en la parte baja de la barriga.  Tener fiebre.  Tiene Programme researcher, broadcasting/film/videomalestar estomacal (nuseas).  Tiene vmitos. Resumen  Una infeccin  urinaria (IU) puede ocurrir en Clinical cytogeneticist de las vas [place].  Esta afeccin es causada por la presencia de grmenes en la zona genital.  Existen muchos factores de riesgo de sufrir una IU. Estos incluyen tener colocado un tubo delgado y  pequeo para drenar el pis y no poder controlar cundo hace pis y materia fecal.  El tratamiento incluye antibiticos contra los grmenes.  Beba suficiente lquido como para Theatre manager la orina de color amarillo plido. Esta informacin no tiene Marine scientist el consejo del mdico. Asegrese de hacerle al mdico cualquier pregunta que tenga. Document Released: 03/29/2010 Document Revised: 10/02/2018 Document Reviewed: 10/02/2018 Elsevier Patient Education  [place] Papanicolaou Pap Test Por qu me debo realizar esta prueba? La prueba de Papanicolaou, tambin denominada citologa vaginal, es una prueba de cribado para Hydrographic surveyor signos de:  Cncer de la vagina, del cuello uterino y del tero. El cuello uterino es la parte baja del tero que se abre hacia la vagina.  Infeccin.  Cambios que podran ser un signo de que se est desarrollando un cncer (cambios precancerosos). Las mujeres deben realizarse esta prueba con regularidad. En general, debe hacerse una prueba de Papanicolaou cada 3 aos hasta alcanzar la menopausia o hasta los 65 aos. Las ConAgra Foods 30 y 53 aos de edad pueden elegir realizarse la prueba de Papanicolaou al mismo tiempo que la prueba del VPH (virus del papiloma humano) cada 5 aos (en lugar de cada 3 aos). El mdico puede recomendarle que se realice pruebas de Papanicolaou con ms o menos frecuencia en funcin de sus afecciones mdicas y los resultados de la prueba de Papanicolaou anterior. Qu tipo de [place] se toma?  El mdico recolectar una muestra de clulas de la superficie del cuello uterino. Lo har utilizando un pequeo hisopo de algodn, una esptula de plstico o un cepillo. Esta muestra se recolecta durante un examen plvico, mientras usted est recostada boca arriba sobre la mesa de examen con los pies en los descansos para pies (estribos). En algunos casos, tambin pueden recolectarse fluidos (secreciones) del cuello uterino y  la vagina. Cmo debo prepararme para esta prueba?  Tenga en cuenta en qu etapa del ciclo menstrual se encuentra. Es posible que se le pida que vuelva a Risk manager la prueba si est Forensic psychologist en que debe Radiation protection practitioner.  Si el da en que debe realizarse la prueba tiene una infeccin vaginal aparente, deber volver a Editor, commissioning prueba.  Siga las indicaciones del mdico acerca de lo siguiente: ? Cambiar o suspender los medicamentos que toma habitualmente. Algunos medicamentos pueden [employer] de la prueba, como los digitlicos y Lexicographer. ? Evite las duchas vaginales o los baos de inmersin el da de la prueba o Games developer anterior. Informe al mdico acerca de lo siguiente:  Cualquier alergia que tenga.  Todos los Lyondell Chemical, incluidos vitaminas, hierbas, gotas oftlmicas, cremas y medicamentos de venta libre.  Cualquier enfermedad de la sangre que tenga.  Cirugas a las que se someti.  Cualquier afeccin mdica que tenga.  Si est embarazada o podra estarlo. Cmo se informan los resultados? Los [employer] Industries de la prueba se informarn como anormales o normales. Puede producirse un resultado positivo falso. Este tipo de resultado es incorrecto porque indica que una enfermedad est presente cuando en realidad no lo est. Puede producirse un resultado negativo falso. Este tipo de resultado es incorrecto porque indica que una enfermedad no est presente cuando en realidad lo est. Sander Nephew significan  los resultados? Un resultado normal en la prueba significa que no tiene signos de cncer de la vagina, del cuello uterino o del tero. Un resultado anormal puede significar que tiene:  Cncer. Una prueba de Papanicolaou por s sola no es suficiente para Consulting civil engineer. En este caso, se le realizarn ms pruebas.  Cambios precancerosos en la vagina, cuello uterino o tero.  Inflamacin del cuello uterino.  Enfermedades de transmisin sexual  (ETS).  Infecciones por hongos.  Infecciones por parsitos. Hable con su mdico sobre lo que significan sus Dell. Preguntas para hacerle al mdico Consulte a su mdico o pregunte en el departamento donde se realiza la prueba acerca de lo siguiente:  Cundo estarn disponibles mis resultados?  Cmo obtendr mis resultados?  Cules son mis opciones de tratamiento?  Qu otras pruebas necesito?  Cules son los prximos pasos que debo seguir? Resumen  En general, las mujeres deben hacerse una prueba de Papanicolaou cada 3 aos Engineer, maintenance la menopausia o [employer] Corporation 65 aos de [place].  El mdico recolectar una muestra de clulas de la superficie del cuello uterino. Lo har utilizando un pequeo hisopo de algodn, una esptula de plstico o un cepillo.  En algunos casos, tambin pueden recolectarse fluidos (secreciones) del cuello uterino y la vagina. Esta informacin no tiene Theme park manager el consejo del mdico. Asegrese de hacerle al mdico cualquier pregunta que tenga. Document Released: 03/27/2008 Document Revised: 09/18/2017 Document Reviewed: 09/18/2017 Elsevier Patient Education  2020 [employer].

## 2019-09-24 NOTE — Progress Notes (Signed)
   Subjective:    Patient ID: Loretta [name], female    DOB: 1985/11/23, 33 y.o.   MRN: [id number]   CC: Abdominal pain, burning with urination  HPI:  Patient is a very pleasant 33 year old female that presents today with pain in her lower abdomen as well as burning with urination.  Patient states that this pain comes and goes throughout the day and it feels somewhat like "period pain" but is lasting longer than hers typically do.  She states that this pain started around November 7 when she was expecting her period to start however her periods did not occur until November 25.   She denies any new sexual partners and says that she is sexually active with her husband.  She endorses that she does have some increased urinary frequency that has occurred since this pain initiated.  Patient states she uses Nexplanon for birth control.  She got this installed in October of last year after the birth of her daughter.  She states that since having this installed she has had irregular periods.  She states that she is due to have a repeat Pap smear today.  ROS: pertinent noted in the HPI   Pertinent PMH, PSH, FH, SoHx: Social history: Married, only has sexual relations with husband  Objective:  BP 102/64   Pulse (!) 134   Ht 5\' 2"  (1.575 m)   Wt 157 lb 6 oz (71.4 kg)   LMP 09/17/2019   SpO2 98%   BMI 28.78 kg/m   Vitals and nursing note reviewed  General: NAD, pleasant, able to participate in exam Cardiac: RRR, no murmurs. Respiratory: CTAB Abdomen: Bowel sounds present, nondistended, some suprapubic tenderness to deep palpation Pelvic: No external lesions, some blood present in posterior vaginal vault.  Assessment & Plan:   Suprapubic pain Plan: -GC chlamydia -Wet prep -Urinalysis  -Pap smear -If Pap smear is abnormal will consider colposcopy  Lurline Del, [place] [facility] PGY-1

## 2019-09-24 NOTE — Assessment & Plan Note (Signed)
Plan: -GC chlamydia -Wet prep -Urinalysis

## 2019-09-25 LAB — HIV ANTIBODY (ROUTINE TESTING W REFLEX): HIV Screen 4th Generation wRfx: NONREACTIVE

## 2019-09-26 LAB — CYTOLOGY - PAP
Chlamydia: NEGATIVE
Comment: NEGATIVE
Comment: NEGATIVE
Comment: NORMAL
Diagnosis: NEGATIVE
High risk HPV: NEGATIVE
Neisseria Gonorrhea: NEGATIVE

## 2019-09-29 ENCOUNTER — Encounter: Payer: Self-pay | Admitting: Family Medicine

## 2019-09-30 NOTE — Progress Notes (Signed)
Patient called and notified of negative results.

## 2019-10-13 ENCOUNTER — Ambulatory Visit: Payer: Medicaid Other | Admitting: Family Medicine

## 2019-10-13 ENCOUNTER — Other Ambulatory Visit: Payer: Self-pay

## 2019-10-13 ENCOUNTER — Encounter: Payer: Self-pay | Admitting: Family Medicine

## 2019-10-13 VITALS — BP 126/78 | HR 115 | Wt 159.0 lb

## 2019-10-13 DIAGNOSIS — N946 Dysmenorrhea, unspecified: Secondary | ICD-10-CM | POA: Diagnosis not present

## 2019-10-13 DIAGNOSIS — R102 Pelvic and perineal pain: Secondary | ICD-10-CM | POA: Diagnosis not present

## 2019-10-13 LAB — POCT URINE PREGNANCY: Preg Test, Ur: NEGATIVE

## 2019-10-13 NOTE — Patient Instructions (Addendum)
It was great to see you!  Our plans for today:  -For today we are going to check a CBC and a pregnancy test -I am going to order a pelvic ultrasound, they should contact you to schedule this -I would like for you to take ibuprofen 400 mg every 6 hours on the days that you have pelvic pain and for the next 2-3 days after this. -I would like to see you for follow-up once the pelvic ultrasound is done and to go over your results. -We have you scheduled for December 29 for the ultrasound and for January 6 for further discussion of your Nexplanon removal.  Take care and seek immediate care sooner if you develop any concerns.   Estuvo muy bien verte!  Nuestros planes para hoy: -Por hoy vamos a chequear un CBC y [place] prueba de Media planner -Voy a ordenar una ecografa plvica, deben contactarte para programar esto -Me gustara que tomara ibuprofeno 400 mg cada 6 horas los das que tenga dolor plvico y [employer] prximos 2-3 das posteriores a [place]. -Me gustara verte para un seguimiento una vez que se haya realizado la ecografa plvica y para repasar tus resultados. -Te hemos programado para el 65 de diciembre para el ultrasonido y para el 6 de enero para una mayor discusin sobre la eliminacin de Nexplanon.  Tenga cuidado y busque atencin inmediata antes si tiene alguna inquietud.   Dr. [facility]

## 2019-10-13 NOTE — Progress Notes (Signed)
   Subjective:    Patient ID: Loretta [name], female    DOB: 03-21-1986, 33 y.o.   MRN: [id number]   CC: Follow-up for pelvic pain  HPI:  Pelvic pain: Patient is a very pleasant 33 year old female that presents today for follow-up to go over her lab work and to further discuss her pelvic pain.  Patient states that in October 2019 she received a Nexplanon after giving birth to her child.  She states that for about 9 months after this she had no menstrual periods but then about 3 months ago she started having irregular periods as well as "period-like pain".  She states that in the past she has never had the symptoms and attributes these to receiving her Nexplanon.  She states she has tried some Tylenol which is helped some but does not like using medications long-term and so she only used it for a few days.  She states that over the last 3 months she feels this pain is getting worse it is of a "crampy" consistency and about 7/10 pain at its worst.  He states his pain is located in her lower abdomen on both sides very similar to cramping she gets during periods. She also states she would like to have the Nexplanon removed and would like to try a different form of contraception such as an injection.  Patient states that her periods have been occurring approximately 2 times per month for the past 3 months that she goes through 5-6 pads per day during these periods.  She states that she typically has a period for about 7 days.  Patient had recently come in for the same problem and received a negative GC/chlamydia, negative wet prep, and normal Pap smear.  Her urinalysis at this time was also negative.   ROS: pertinent noted in the HPI   Pertinent PMH, PSH, FH, SoHx: Patient with past medical history of pelvic pain for about 3 months.  Had Nexplanon inserted in 2019.  Objective:  BP 126/78   Pulse (!) 115   Wt 159 lb (72.1 kg)   LMP 10/02/2019   SpO2 98%   BMI 29.08 kg/m    Vitals and nursing note reviewed  General: NAD, pleasant, able to participate in exam Cardiac: RRR, no murmurs. Respiratory: CTAB, normal effort Abdomen: Bowel sounds present, nondistended, nontender, including no suprapubic tenderness to palpation  Assessment & Plan:    Pelvic pain Assessment: Pelvic pain that appears to be cyclic, worse with periods and started after patient got her Nexplanon.  Patient has a negative GC and chlamydia.  Possibilities include corpus luteum cyst, endometriosis, or secondary to her Nexplanon which I feel is most likely as the symptoms started after having inserted. Plan: -We will check urine pregnancy test today -We will schedule for pelvic ultrasound next Tuesday 10/29 -Patient will use ibuprofen 400 mg every 6 hours for the day of and 2-3 days after she has pelvic pain -Patient request to have her Nexplanon removed and is interested in possible injection for contraception.  We will schedule her for this.  Briefly discussed other forms of contraception including OCPs, IUDs, etc.   Lurline Del, [place] PGY-1

## 2019-10-13 NOTE — Assessment & Plan Note (Signed)
Assessment: Pelvic pain that appears to be cyclic, worse with periods and started after patient got her Nexplanon.  Patient has a negative GC and chlamydia.  Possibilities include corpus luteum cyst, endometriosis, or secondary to her Nexplanon which I feel is most likely as the symptoms started after having inserted. Plan: -We will check urine pregnancy test today -We will schedule for pelvic ultrasound next Tuesday 10/29 -Patient will use ibuprofen 400 mg every 6 hours for the day of and 2-3 days after she has pelvic pain -Patient request to have her Nexplanon removed and is interested in possible injection for contraception.  We will schedule her for this.

## 2019-10-14 LAB — CBC WITH DIFFERENTIAL/PLATELET
Basophils Absolute: 0 10*3/uL (ref 0.0–0.2)
Basos: 0 %
EOS (ABSOLUTE): 0 10*3/uL (ref 0.0–0.4)
Eos: 0 %
Hematocrit: 43.1 % (ref 34.0–46.6)
Hemoglobin: 14.3 g/dL (ref 11.1–15.9)
Immature Grans (Abs): 0 10*3/uL (ref 0.0–0.1)
Immature Granulocytes: 0 %
Lymphocytes Absolute: 1.8 10*3/uL (ref 0.7–3.1)
Lymphs: 22 %
MCH: 28.7 pg (ref 26.6–33.0)
MCHC: 33.2 g/dL (ref 31.5–35.7)
MCV: 87 fL (ref 79–97)
Monocytes Absolute: 0.4 10*3/uL (ref 0.1–0.9)
Monocytes: 5 %
Neutrophils Absolute: 5.8 10*3/uL (ref 1.4–7.0)
Neutrophils: 73 %
Platelets: 369 10*3/uL (ref 150–450)
RBC: 4.98 x10E6/uL (ref 3.77–5.28)
RDW: 13.4 % (ref 11.7–15.4)
WBC: 8 10*3/uL (ref 3.4–10.8)

## 2019-10-21 ENCOUNTER — Ambulatory Visit (HOSPITAL_COMMUNITY)
Admission: RE | Admit: 2019-10-21 | Discharge: 2019-10-21 | Disposition: A | Discharge status: Home / Self Care | Payer: Medicaid Other | Source: Ambulatory Visit | Attending: Family Medicine | Admitting: Family Medicine

## 2019-10-21 ENCOUNTER — Other Ambulatory Visit: Payer: Self-pay

## 2019-10-21 DIAGNOSIS — R102 Pelvic and perineal pain: Secondary | ICD-10-CM | POA: Insufficient documentation

## 2019-10-21 DIAGNOSIS — N83291 Other ovarian cyst, right side: Secondary | ICD-10-CM | POA: Diagnosis not present

## 2019-10-21 DIAGNOSIS — N946 Dysmenorrhea, unspecified: Secondary | ICD-10-CM | POA: Diagnosis not present

## 2019-10-29 ENCOUNTER — Ambulatory Visit (INDEPENDENT_AMBULATORY_CARE_PROVIDER_SITE_OTHER): Payer: Medicaid Other | Admitting: Student in an Organized Health Care Education/Training Program

## 2019-10-29 ENCOUNTER — Other Ambulatory Visit: Payer: Self-pay

## 2019-10-29 DIAGNOSIS — Z3046 Encounter for surveillance of implantable subdermal contraceptive: Secondary | ICD-10-CM

## 2019-10-29 MED ORDER — NORGESTIMATE-ETH ESTRADIOL 0.25-35 MG-MCG PO TABS: 1.0000 | ORAL_TABLET | Freq: Every day | ORAL | 11 refills | Status: DC

## 2019-10-29 NOTE — Assessment & Plan Note (Addendum)
Patient has continued desire to remove Nexplanon at this time. She does not desire conception at this time.  Due to dysmenorrhea, prescribing OCP. Risk factors include BMI of 29.3. Nexplanon was removed without complication. Pelvic ultrasound showed simple ovarian cyst greater than 3 cm increasing risk of ovarian torsion so would have low index of suspicion for emergency visit if she experiences new and worsening pelvic pain from chronic.  Otherwise, will likely not need follow-up for this finding. Could consider repeat US of ovary in 4-6 weeks.

## 2019-10-29 NOTE — Progress Notes (Signed)
Subjective:    Patient ID: Loretta [name], female    DOB: 31-May-1986, 34 y.o.   MRN: [id number]  CC: nexplanon removal  HPI:  Patient has had thorough workup of pelvic pain s/p vaginal delivery 06/2018 complicated by choledocolithiasis. obvious explanation not found. Pelvic US showed simple ovarian cyst 3.6cm and <1cm endometrial polyp. Returns today to remove nexplanon as she feels this is causing her pain and would like depo. Her pain is cyclic with menstrual cycle.  LMP December 7th. Has been having a regular periods approximately 2 times per month for the past several months. Periods last for approximately 7 days and are described as heavy.  Patient denies any symptoms of anemia including lightheadedness, fatigue, palpitations.  Not experiencing the pain currently. Patient is a non-smoker and without history of blood clots.  BMI 29.3 Tachycardic on admission to appointment but likely due to anxiety.  Smoking status reviewed   ROS: pertinent noted in the HPI   I have personally reviewed pertinent past medical history, surgical, family, and social history as appropriate. Objective:  BP 120/75   Pulse (!) 119   Wt 160 lb 3.2 oz (72.7 kg)   LMP 10/02/2019   SpO2 100%   BMI 29.30 kg/m   Vitals and nursing note reviewed  General: NAD, pleasant, able to participate in exam Extremities: no edema or cyanosis. S/p implanon removal negative for bleeding. ~1cm incision. Skin: warm and dry, no rashes noted.  Nexplanon palpated in the left Neuro: alert, no obvious focal deficits Psych: Normal affect and mood. Moderately anxious/tearful for procedure which was quickly improved once the procedure was complete.  Assessment & Plan:   Encounter for Nexplanon removal Patient has continued desire to remove Nexplanon at this time. She does not desire conception at this time.  Due to dysmenorrhea, prescribing OCP. Risk factors include BMI of 29.3. Nexplanon was removed without  complication. Pelvic ultrasound showed simple ovarian cyst greater than 3 cm increasing risk of ovarian torsion so would have low index of suspicion for emergency visit if she experiences new and worsening pelvic pain from chronic.  Otherwise, will likely not need follow-up for this finding. Could consider repeat US of ovary in 4-6 weeks.   Meds ordered this encounter  Medications  . norgestimate-ethinyl estradiol (SPRINTEC 28) 0.25-35 MG-MCG tablet    Sig: Take 1 tablet by mouth daily.    Dispense:  1 Package    Refill:  11    I independently examined pertinent imaging in relation to problem.   Jamelle Rushing, DO [facility] PGY-2   Nexplanon Removal Procedure Note  PRE-OP DIAGNOSIS: Nexplanon, desire for change of contraception   POST-OP DIAGNOSIS: Same   Allergies were reviewed prior to procedure and patient denied any allergies to lidocaine or latex.  Risks and benefits of removal were discussed prior to procedure to include bleeding and infections risks at incision site. preventative precautions were also covered.   PROCEDURE: Nexplanon Removal  Performing Physician: Jamelle Rushing, DO PROCEDURE:  Anesthesia: 2% Lidocaine w/ epinephrine 2 ml  Procedure: Consent obtained. A time-out was performed prior to initiating procedure to be sure of right patient and right location. The area surrounding the Nexplanon was prepared with Choloraprep and draped in the usual sterile manner. The site was anesthetized with lidocaine. A skin incision was made over the distal aspect of the device. The capsule lysed sharply and the device removed using a hemostat. Hemostasis was assured. The site was dressed with SteriStrips  and a pressure dressing. The patient tolerated the procedure well.  Standard post-procedure care is explained and return precautions are given.  Backup contraception is advised until conception is desired.  Prescribed Sprintec.

## 2019-10-29 NOTE — Patient Instructions (Signed)
It was a pleasure to see you today!  To summarize our discussion for this visit:  We have removed your nexplanon and will use an oral birth control pill to help regulate your menstrual cycle. If you continue to have pelvic pain, please contact your PCP for further work up.   emos eliminado su nexplanon y utilizaremos una pldora anticonceptiva oral para ayudar a regular su ciclo menstrual. Si contina teniendo dolor plvico, comunquese con su PCP para realizar ms estudios.  Call the clinic at [phone] if your symptoms worsen or you have any concerns.   Thank you for allowing me to take part in your care,  Dr. Jamelle Rushing

## 2020-01-28 DIAGNOSIS — Z20828 Contact with and (suspected) exposure to other viral communicable diseases: Secondary | ICD-10-CM | POA: Diagnosis not present

## 2020-03-20 ENCOUNTER — Emergency Department (HOSPITAL_COMMUNITY): Payer: Medicaid Other

## 2020-03-20 ENCOUNTER — Other Ambulatory Visit: Payer: Self-pay

## 2020-03-20 ENCOUNTER — Encounter (HOSPITAL_COMMUNITY): Payer: Self-pay | Admitting: Emergency Medicine

## 2020-03-20 ENCOUNTER — Emergency Department (HOSPITAL_COMMUNITY)
Admission: EM | Admit: 2020-03-20 | Discharge: 2020-03-20 | Disposition: A | Discharge status: Home / Self Care | Payer: Medicaid Other | Attending: Emergency Medicine | Admitting: Emergency Medicine

## 2020-03-20 DIAGNOSIS — Z79899 Other long term (current) drug therapy: Secondary | ICD-10-CM | POA: Diagnosis not present

## 2020-03-20 DIAGNOSIS — Z793 Long term (current) use of hormonal contraceptives: Secondary | ICD-10-CM | POA: Insufficient documentation

## 2020-03-20 DIAGNOSIS — R000 Tachycardia, unspecified: Secondary | ICD-10-CM | POA: Diagnosis not present

## 2020-03-20 DIAGNOSIS — R112 Nausea with vomiting, unspecified: Secondary | ICD-10-CM | POA: Insufficient documentation

## 2020-03-20 DIAGNOSIS — R1011 Right upper quadrant pain: Secondary | ICD-10-CM | POA: Diagnosis not present

## 2020-03-20 DIAGNOSIS — R1013 Epigastric pain: Secondary | ICD-10-CM | POA: Diagnosis not present

## 2020-03-20 DIAGNOSIS — K8020 Calculus of gallbladder without cholecystitis without obstruction: Secondary | ICD-10-CM | POA: Diagnosis not present

## 2020-03-20 HISTORY — DX: Gastro-esophageal reflux disease without esophagitis: K21.9

## 2020-03-20 LAB — I-STAT BETA HCG BLOOD, ED (MC, WL, AP ONLY): I-stat hCG, quantitative: <5 m[IU]/mL (ref <5)

## 2020-03-20 LAB — CBC
HCT: 43.6 % (ref 36.0–46.0)
Hemoglobin: 14.5 g/dL (ref 12.0–15.0)
MCH: 28.8 pg (ref 26.0–34.0)
MCHC: 33.3 g/dL (ref 30.0–36.0)
MCV: 86.5 fL (ref 80.0–100.0)
Platelets: 381 10*3/uL (ref 150–400)
RBC: 5.04 MIL/uL (ref 3.87–5.11)
RDW: 13.1 % (ref 11.5–15.5)
WBC: 14.1 10*3/uL — ABNORMAL HIGH (ref 4.0–10.5)
nRBC: 0 % (ref 0.0–0.2)

## 2020-03-20 LAB — COMPREHENSIVE METABOLIC PANEL
ALT: 34 U/L (ref 0–44)
AST: 26 U/L (ref 15–41)
Albumin: 4 g/dL (ref 3.5–5.0)
Alkaline Phosphatase: 90 U/L (ref 38–126)
Anion gap: 10 (ref 5–15)
BUN: 7 mg/dL (ref 6–20)
CO2: 23 mmol/L (ref 22–32)
Calcium: 9.2 mg/dL (ref 8.9–10.3)
Chloride: 105 mmol/L (ref 98–111)
Creatinine, Ser: 0.63 mg/dL (ref 0.44–1.00)
GFR calc Af Amer: >60 mL/min (ref >60)
GFR calc non Af Amer: >60 mL/min (ref >60)
Glucose, Bld: 105 mg/dL — ABNORMAL HIGH (ref 70–99)
Potassium: 3.9 mmol/L (ref 3.5–5.1)
Sodium: 138 mmol/L (ref 135–145)
Total Bilirubin: 0.2 mg/dL — ABNORMAL LOW (ref 0.3–1.2)
Total Protein: 7.8 g/dL (ref 6.5–8.1)

## 2020-03-20 LAB — URINALYSIS, ROUTINE W REFLEX MICROSCOPIC
Bilirubin Urine: NEGATIVE
Glucose, UA: NEGATIVE mg/dL
Hgb urine dipstick: NEGATIVE
Ketones, ur: NEGATIVE mg/dL
Nitrite: NEGATIVE
Protein, ur: NEGATIVE mg/dL
Specific Gravity, Urine: 1.018 (ref 1.005–1.030)
pH: 6 (ref 5.0–8.0)

## 2020-03-20 LAB — LIPASE, BLOOD: Lipase: 23 U/L (ref 11–51)

## 2020-03-20 MED ORDER — LIDOCAINE VISCOUS HCL 2 % MT SOLN
15.0000 mL | Freq: Once | OROMUCOSAL | Status: AC
  Administered 2020-03-20: 15 mL via ORAL
  Filled 2020-03-20: qty 15

## 2020-03-20 MED ORDER — SODIUM CHLORIDE 0.9 % IV BOLUS
500.0000 mL | Freq: Once | INTRAVENOUS | Status: AC
  Administered 2020-03-20: 500 mL via INTRAVENOUS

## 2020-03-20 MED ORDER — MORPHINE SULFATE (PF) 4 MG/ML IV SOLN
4.0000 mg | Freq: Once | INTRAVENOUS | Status: AC
  Administered 2020-03-20: 4 mg via INTRAVENOUS
  Filled 2020-03-20: qty 1

## 2020-03-20 MED ORDER — SODIUM CHLORIDE 0.9% FLUSH: 3.0000 mL | Freq: Once | INTRAVENOUS | Status: DC

## 2020-03-20 MED ORDER — ALUM & MAG HYDROXIDE-SIMETH 200-200-20 MG/5ML PO SUSP: 30.0000 mL | Freq: Once | ORAL | Status: DC

## 2020-03-20 MED ORDER — ALUM & MAG HYDROXIDE-SIMETH 200-200-20 MG/5ML PO SUSP
30.0000 mL | Freq: Once | ORAL | Status: AC
  Administered 2020-03-20: 30 mL via ORAL
  Filled 2020-03-20: qty 30

## 2020-03-20 MED ORDER — LIDOCAINE VISCOUS HCL 2 % MT SOLN: 15.0000 mL | Freq: Once | OROMUCOSAL | Status: DC

## 2020-03-20 NOTE — ED Notes (Signed)
Pt tearful during assessment d/t 10/10 pain

## 2020-03-20 NOTE — ED Triage Notes (Signed)
C/o mid upper abd pain that woke her up at 1am with nausea and vomited x 1.  Denies diarrhea and urinary complaints.  Takes medication for acid reflux.

## 2020-03-20 NOTE — Discharge Instructions (Signed)
Segn nuestra discusin, haga un seguimiento con la ciruga de [place] para discutir su dolor abdominal recurrente.  Si sus sntomas empeoran, regrese al departamento de emergencias para una reevaluacin. Es particularmente importante si tiene fiebre o dolor que no puede controlar con medicamentos de Mudlogger.  Evite los alimentos grasos para reducir Chief Technology Officer de la vescula biliar.

## 2020-03-20 NOTE — ED Provider Notes (Signed)
[facility] EMERGENCY DEPARTMENT Provider Note   CSN: [id number] Arrival date & time: 03/20/20  8242     History Chief Complaint  Patient presents with  . Abdominal Pain    Loretta [name] is a 34 y.o. female.  HPI HPI Comments: Loretta [name] is a 34 y.o. female who presents to the Emergency Department complaining of upper abdominal pain.  Patient states that starting at 1 AM last night she woke with 6/10 epigastric pain.  She reports an episode of vomiting with intermittent nausea.  No current nausea.  Vomiting was nonbilious and nonbloody.  She additionally reports 3 x bowel movements this morning.  No diarrhea.  No hematochezia.  She confirms a history of similar symptoms and was told she has "gallstones".  She reports associated chills.  She denies fevers, URI symptoms, chest pain, shortness of breath, constipation, urinary changes, syncope.     Past Medical History:  Diagnosis Date  . Acid reflux   . Cholecystitis 08/2018    Patient Active Problem List   Diagnosis Date Noted  . Encounter for Nexplanon removal 10/29/2019  . Pelvic pain 10/13/2019  . Depressed mood 11/06/2018  . Musculoskeletal back pain 11/06/2018  . Suprapubic pain 11/06/2018  . Choledocholithiasis 09/03/2018  . RUQ pain   . Calculus of gallbladder without cholecystitis without obstruction   . Unwanted fertility 02/12/2018  . Language barrier 02/12/2018  . Gastroesophageal reflux disease 12/27/2017    Past Surgical History:  Procedure Laterality Date  . NO PAST SURGERIES       OB History    Gravida  3   Para  3   Term  3   Preterm      AB      Living  3     SAB      TAB      Ectopic      Multiple  0   Live Births  3           No family history on file.  Social History   Tobacco Use  . Smoking status: Never Smoker  . Smokeless tobacco: Never Used  Substance Use Topics  . Alcohol use: No  . Drug use: No     Home Medications Prior to Admission medications   Medication Sig Start Date End Date Taking? Authorizing Provider  ferrous fumarate (HEMOCYTE - 106 MG FE) 325 (106 Fe) MG TABS tablet Take 1 tablet (106 mg of iron total) by mouth 2 (two) times daily. Patient not taking: Reported on 07/29/2018 05/16/18   Starr Lake, CNM  lansoprazole (PREVACID) 15 MG capsule Take 15 mg by mouth daily at 12 noon.    [provider]  norgestimate-ethinyl estradiol ([place] 28) 0.25-35 MG-MCG tablet Take 1 tablet by mouth daily. 10/29/19   Anderson, Thomson, DO  Prenatal Multivit-Min-Fe-FA (PRENATAL VITAMINS PO) Take by mouth.    [provider]  sertraline (ZOLOFT) 25 MG tablet Take 1 tablet (25 mg total) by mouth daily. 11/06/18   Steve Rattler, DO    Allergies    Patient has no known allergies.  Review of Systems   Review of Systems  All other systems reviewed and are negative. Ten systems reviewed and are negative for acute change, except as noted in the HPI.   Physical Exam Updated Vital Signs BP (!) 130/92 (BP Location: Right Arm)   Pulse (!) 113   Temp 98.5 F (36.9 C) (Oral)   Resp  16   Ht 5' 1.02" (1.55 m)   Wt 68 kg   LMP 02/25/2020   SpO2 100%   BMI 28.32 kg/m   Physical Exam Vitals and nursing note reviewed.  Constitutional:      General: She is in acute distress.     Appearance: Normal appearance. She is not ill-appearing, toxic-appearing or diaphoretic.  HENT:     Head: Normocephalic and atraumatic.     Right Ear: External ear normal.     Left Ear: External ear normal.     Nose: Nose normal.     Mouth/Throat:     Mouth: Mucous membranes are moist.     Pharynx: Oropharynx is clear. No oropharyngeal exudate or posterior oropharyngeal erythema.  Eyes:     General: No scleral icterus.    Extraocular Movements: Extraocular movements intact.     Pupils: Pupils are equal, round, and reactive to light.  Cardiovascular:     Rate and Rhythm:  Regular rhythm. Tachycardia present.     Pulses: Normal pulses.     Heart sounds: Normal heart sounds. No murmur. No friction rub. No gallop.   Pulmonary:     Effort: Pulmonary effort is normal. No respiratory distress.     Breath sounds: Normal breath sounds. No stridor. No wheezing, rhonchi or rales.  Chest:     Chest wall: No tenderness.  Abdominal:     General: Abdomen is flat and protuberant. Bowel sounds are normal. There is no distension.     Palpations: Abdomen is soft.     Tenderness: There is abdominal tenderness in the epigastric area. There is no right CVA tenderness or left CVA tenderness. Negative signs include Murphy's sign and McBurney's sign.  Musculoskeletal:        General: Normal range of motion.     Cervical back: Normal range of motion and neck supple. No tenderness.  Skin:    General: Skin is warm and dry.  Neurological:     General: No focal deficit present.     Mental Status: She is alert and oriented to person, place, and time.  Psychiatric:        Mood and Affect: Mood normal.        Behavior: Behavior normal.    ED Results / Procedures / Treatments   Labs (all labs ordered are listed, but only abnormal results are displayed) Labs Reviewed  COMPREHENSIVE METABOLIC PANEL - Abnormal; Notable for the following components:      Result Value   Glucose, Bld 105 (*)    Total Bilirubin 0.2 (*)    All other components within normal limits  CBC - Abnormal; Notable for the following components:   WBC 14.1 (*)    All other components within normal limits  URINALYSIS, ROUTINE W REFLEX MICROSCOPIC - Abnormal; Notable for the following components:   APPearance HAZY (*)    Leukocytes,Ua SMALL (*)    Bacteria, UA RARE (*)    All other components within normal limits  LIPASE, BLOOD  I-STAT BETA HCG BLOOD, ED ([facility], [facility], [facility] ONLY)    EKG EKG Interpretation  Date/Time:  Saturday Mar 20 2020 08:01:15 EDT Ventricular Rate:  105 PR Interval:  136 QRS  Duration: 72 QT Interval:  338 QTC Calculation: 446 R Axis:   71 Text Interpretation: Sinus tachycardia Nonspecific ST abnormality Confirmed by Cathren Laine ([id number]) on 03/20/2020 9:00:17 AM   Radiology US Abdomen Limited RUQ  Result Date: 03/20/2020 CLINICAL DATA:  Right upper quadrant pain EXAM:  ULTRASOUND ABDOMEN LIMITED RIGHT UPPER QUADRANT COMPARISON:  None. FINDINGS: Gallbladder: Gallstones measuring up to 8 mm. No wall thickening visualized. No sonographic Murphy sign noted by sonographer. Common bile duct: Diameter: 4 mm, normal Liver: No focal lesion identified. Within normal limits in parenchymal echogenicity. Portal vein is patent on color Doppler imaging with normal direction of blood flow towards the liver. Other: None. IMPRESSION: Cholelithiasis without sonographic evidence of acute cholecystitis. Electronically Signed   By: Guadlupe Spanish M.D.   On: 03/20/2020 09:52   Procedures Procedures (including critical care time)  Medications Ordered in ED Medications  sodium chloride flush (NS) 0.9 % injection 3 mL (has no administration in time range)  sodium chloride 0.9 % bolus 500 mL (500 mLs Intravenous New Bag/Given 03/20/20 0914)  morphine 4 MG/ML injection 4 mg (4 mg Intravenous Given 03/20/20 0914)   ED Course  I have reviewed the triage vital signs and the nursing notes.  Pertinent labs & imaging results that were available during my care of the patient were reviewed by me and considered in my medical decision making (see chart for details).    MDM Rules/Calculators/A&P                      Patient presents with epigastric and right upper quadrant pain.  Her vital signs are stable.  She was initially tachycardic in the 120s.  She was given morphine which reduced this to around 105.  She was then given a GI cocktail and was no longer tachycardic while was in the room.  She is saturating well.  She is afebrile.  Her labs show a mild leukocytosis of 14.1.  UA shows small  leukocytes and rare bacteria.  Ultrasound shows cholelithiasis without sonographic evidence of acute cholecystitis.  I discussed this with the patient via Spanish interpreter.  She was evaluated for this once prior and admitted.  She elected not to have her gallbladder removed because she was currently breast-feeding a young child.  She states that she would like to elect to have it removed at this time.  She was given general surgery referral.  She understands to follow-up with them regarding her symptoms.  I recommended that she abstain from fatty foods.  She was given very strict return precautions and knows to return to the emergency department if her symptoms once again worsen.  Her questions were answered and she was amicable at the time of discharge.  Her vital signs are stable.  Patient discharged to home/self care.  Condition at discharge: Stable  Note: Portions of this report may have been transcribed using voice recognition software. Every effort was made to ensure accuracy; however, inadvertent computerized transcription errors may be present.     Final Clinical Impression(s) / ED Diagnoses Final diagnoses:  Abdominal pain, epigastric  RUQ abdominal pain    Rx / DC Orders ED Discharge Orders    None       Placido Sou, PA-C 03/20/20 1300    Cathren Laine, MD 03/20/20 1359

## 2020-04-16 DIAGNOSIS — K8020 Calculus of gallbladder without cholecystitis without obstruction: Secondary | ICD-10-CM | POA: Diagnosis not present

## 2020-04-21 ENCOUNTER — Ambulatory Visit: Payer: Self-pay | Admitting: General Surgery

## 2020-04-21 NOTE — H&P (View-Only) (Signed)
Loretta [name] Appointment: 04/16/2020 3:00 PM Location: [facility] Patient #: [id number] DOB: [birth year] Married / Language: Undefined / Race: Undefined Female  History of Present Illness Loretta Hiss M. Gelene Recktenwald MD; 04/21/2020 1:08 PM) The patient is a 34 year old female who presents for evaluation of gall stones. interview and counseling done with phone spanish interperter - pacific interpeter.  She comes in because of ongoing epigastric and right upper quadrant pain. I initially met her over a year ago for discussion about gallbladder disease. At that time she wanted to continue to observe the area. However she had a recent episode that was quite severe which prompted her to go to the emergency room on May 29. I reviewed that encounter. Her ultrasound again showed gallstones. Her metabolic panel was normal. She had a mild leukocytosis of 14,000. She continues to have some pain a few times a week. But is now ready for surgery. She denies any fevers or chills. When episodes happen they may last for a few hours and she will have nausea. It will sometimes radiate to her back. Otherwise she denies any medical changes since I last saw her other than increasing frequency of gallbladder attacks.  11/20/18 interview and counseling done with phone spanish interperter - pacific interpeter.  She is referred by Dr Loretta [name] for evaluation of gallstones. The patient went to the emergency room last November and was admitted overnight for epigastric and right upper quadrant pain. She is found to have an elevated AST and ALT with ultrasound evidence of a dilated common bile duct and gallstones and some hepatic steatosis. Gastroenterology was consulted who recommended an MRCP which demonstrated no common bile duct stone and the patient was discharged.  She denies any additional episodes of epigastric and right upper quadrant pain. She denies any nausea, vomiting, diarrhea or  constipation. She reports a good appetite. She denies any fevers or chills. She most recently saw the primary care team for some suprapubic discomfort. She denies any yellowing eyes.   Problem List/Past Medical Loretta Hiss M. Redmond Pulling, MD; 04/21/2020 1:11 PM) SYMPTOMATIC CHOLELITHIASIS (K80.20)  Past Surgical History Loretta Hiss M. Redmond Pulling, MD; 04/21/2020 1:11 PM) Loretta [name] Fissure Repair  Diagnostic Studies History Loretta [name]. Redmond Pulling, MD; 04/21/2020 1:11 PM) Colonoscopy never Mammogram never Pap Smear never  Allergies Loretta [name], RMA; 04/16/2020 2:33 PM) No Known Drug Allergies [11/20/2018]: Allergies Reconciled  Medication History Loretta Hiss M. Redmond Pulling, MD; 04/21/2020 1:11 PM) Lansoprazole (15MG Capsule DR, Oral) Active. traMADol HCl (50MG Tablet, 1 (one) Oral every eight hours, as needed, Taken starting 04/16/2020) Active. Prenatal (Oral) Active.  Social History Loretta Hiss M. Redmond Pulling, MD; 04/21/2020 1:11 PM) Caffeine use Coffee. No alcohol use No drug use Tobacco use Never smoker.  Family History Loretta Hiss M. Redmond Pulling, MD; 04/21/2020 1:11 PM) First Degree Relatives No pertinent family history  Pregnancy / Birth History Loretta [name]. Redmond Pulling, MD; 04/21/2020 1:11 PM) Age at menarche 21 years. Contraceptive History Contraceptive implant. Gravida 3 Length (months) of breastfeeding 7-12 Maternal age 32-25 Para 3 Regular periods  Other Problems Loretta Hiss M. Redmond Pulling, MD; 04/21/2020 1:11 PM) Cholelithiasis Depression Gastroesophageal Reflux Disease Hemorrhoids     Review of Systems Loretta Hiss M. Loretta Cosner MD; 04/21/2020 1:08 PM) All other systems negative  Vitals Loretta [name] RMA; 04/16/2020 2:35 PM) 04/16/2020 2:34 PM Weight: 152.5 lb Height: 62in Body Surface Area: 1.7 m Body Mass Index: 27.89 kg/m  Temp.: 98.7F  Pulse: 102 (Regular)  BP: 120/76(Sitting, Left Arm, Standard)        Physical Exam Loretta Hiss M.  [name]; 04/21/2020 1:08 PM)  General Mental  Status-Alert. General Appearance-Consistent with stated age. Hydration-Well hydrated. Voice-Normal.  Head and Neck Head-normocephalic, atraumatic with no lesions or palpable masses. Trachea-midline. Thyroid Gland Characteristics - normal size and consistency.  Eye Eyeball - Bilateral-Extraocular movements intact. Sclera/Conjunctiva - Bilateral-No scleral icterus.  ENMT Ears -Note:nml ext ears.  Mouth and Throat -Note:lips intact.   Chest and Lung Exam Chest and lung exam reveals -quiet, even and easy respiratory effort with no use of accessory muscles and on auscultation, normal breath sounds, no adventitious sounds and normal vocal resonance. Inspection Chest Wall - Normal. Back - normal.  Breast - Did not examine.  Cardiovascular Cardiovascular examination reveals -normal heart sounds, regular rate and rhythm with no murmurs and normal pedal pulses bilaterally.  Abdomen Inspection Inspection of the abdomen reveals - No Hernias. Skin - Scar - no surgical scars. Palpation/Percussion Palpation and Percussion of the abdomen reveal - Soft, Non Tender, No Rebound tenderness, No Rigidity (guarding) and No hepatosplenomegaly. Auscultation Auscultation of the abdomen reveals - Bowel sounds normal.  Peripheral Vascular Upper Extremity Palpation - Pulses bilaterally normal.  Neurologic Neurologic evaluation reveals -alert and oriented x 3 with no impairment of recent or remote memory. Mental Status-Normal.  Neuropsychiatric The patient's mood and affect are described as -normal. Judgment and Insight-insight is appropriate concerning matters relevant to self.  Musculoskeletal Normal Exam - Left-Upper Extremity Strength Normal and Lower Extremity Strength Normal. Normal Exam - Right-Upper Extremity Strength Normal and Lower Extremity Strength Normal.  Lymphatic Head & Neck  General Head & Neck Lymphatics: Bilateral - Description -  Normal. Axillary - Did not examine. Femoral & Inguinal - Did not examine.    Assessment & Plan Loretta Hiss M. [name]; 04/21/2020 1:11 PM)  SYMPTOMATIC CHOLELITHIASIS (K80.20) Impression: I believe the patient's symptoms are consistent with gallbladder disease.  We rediscussed gallbladder disease. The patient was given educational material in [place]. We discussed non-operative and operative management. We discussed the signs & symptoms of acute cholecystitis  I rediscussed laparoscopic/robotic cholecystectomy with IOC in detail using the interpreter. The patient was given educational material as well as diagrams detailing the procedure. We discussed the risks and benefits of a laparoscopic cholecystectomy including, but not limited to bleeding, infection, injury to surrounding structures such as the intestine or liver, bile leak, retained gallstones, need to convert to an open procedure, prolonged diarrhea, blood clots such as DVT, common bile duct injury, anesthesia risks, and possible need for additional procedures. We discussed the typical post-operative recovery course. I explained that the likelihood of improvement of their symptoms is good. i did offer a rx of a limited number of pain pills for the GB attacks.  The patient has elected to proceed with surgery.  This patient encounter took 27 minutes on day of visit to perform the following: take history, perform exam, review outside records, interpret imaging, counsel the patient on their diagnosis.  Current Plans Pt Education - Pamphlet Given - Laparoscopic Gallbladder Surgery: discussed with patient and provided information. You are being scheduled for surgery- Our schedulers will call you.  You should hear from our office's scheduling department within 5 working days about the location, date, and time of surgery. We try to make accommodations for patient's preferences in scheduling surgery, but sometimes the OR schedule or the surgeon's  schedule prevents [place] from making those accommodations.  If you have not heard from our office [phone]) in 5 working days, call the office and ask for your surgeon's nurse.  If you have other questions about your diagnosis, plan, or surgery, call the office and ask for your surgeon's nurse.  Started traMADol HCl 50 MG Oral Tablet, 1 (one) Tablet every eight hours, as needed, #6, 04/16/2020, No Refill.  Loretta [name]. Redmond Pulling, MD, FACS General, Bariatric, & Minimally Invasive Surgery [facility], [place]

## 2020-04-21 NOTE — H&P (Signed)
Loretta [name] Appointment: 04/16/2020 3:00 PM Location: [facility] Patient #: [id number] DOB: 1986-04-04 Married / Language: Undefined / Race: Undefined Female  History of Present Illness Loretta Hiss M. Craig Wisnewski MD; 04/21/2020 1:08 PM) The patient is a 34 year old female who presents for evaluation of gall stones. interview and counseling done with phone spanish interperter - pacific interpeter.  She comes in because of ongoing epigastric and right upper quadrant pain. I initially met her over a year ago for discussion about gallbladder disease. At that time she wanted to continue to observe the area. However she had a recent episode that was quite severe which prompted her to go to the emergency room on May 29. I reviewed that encounter. Her ultrasound again showed gallstones. Her metabolic panel was normal. She had a mild leukocytosis of 14,000. She continues to have some pain a few times a week. But is now ready for surgery. She denies any fevers or chills. When episodes happen they may last for a few hours and she will have nausea. It will sometimes radiate to her back. Otherwise she denies any medical changes since I last saw her other than increasing frequency of gallbladder attacks.  11/20/18 interview and counseling done with phone spanish interperter - pacific interpeter.  She is referred by Dr Loretta [name] for evaluation of gallstones. The patient went to the emergency room last November and was admitted overnight for epigastric and right upper quadrant pain. She is found to have an elevated AST and ALT with ultrasound evidence of a dilated common bile duct and gallstones and some hepatic steatosis. Gastroenterology was consulted who recommended an MRCP which demonstrated no common bile duct stone and the patient was discharged.  She denies any additional episodes of epigastric and right upper quadrant pain. She denies any nausea, vomiting, diarrhea or  constipation. She reports a good appetite. She denies any fevers or chills. She most recently saw the primary care team for some suprapubic discomfort. She denies any yellowing eyes.   Problem List/Past Medical Loretta Hiss M. Redmond Pulling, MD; 04/21/2020 1:11 PM) SYMPTOMATIC CHOLELITHIASIS (K80.20)  Past Surgical History Loretta Hiss M. Redmond Pulling, MD; 04/21/2020 1:11 PM) Loretta [name] Fissure Repair  Diagnostic Studies History Loretta [name]. Redmond Pulling, MD; 04/21/2020 1:11 PM) Colonoscopy never Mammogram never Pap Smear never  Allergies Loretta [name], Loretta [name]; 04/16/2020 2:33 PM) No Known Drug Allergies [11/20/2018]: Allergies Reconciled  Medication History Loretta Hiss M. Redmond Pulling, MD; 04/21/2020 1:11 PM) Lansoprazole (15MG Capsule DR, Oral) Active. traMADol HCl (50MG Tablet, 1 (one) Oral every eight hours, as needed, Taken starting 04/16/2020) Active. Prenatal (Oral) Active.  Social History Loretta Hiss M. Redmond Pulling, MD; 04/21/2020 1:11 PM) Caffeine use Coffee. No alcohol use No drug use Tobacco use Never smoker.  Family History Loretta Hiss M. Redmond Pulling, MD; 04/21/2020 1:11 PM) First Degree Relatives No pertinent family history  Pregnancy / Birth History Loretta [name]. Redmond Pulling, MD; 04/21/2020 1:11 PM) Age at menarche 72 years. Contraceptive History Contraceptive implant. Gravida 3 Length (months) of breastfeeding 7-12 Maternal age 56-25 Para 3 Regular periods  Other Problems Loretta Hiss M. Redmond Pulling, MD; 04/21/2020 1:11 PM) Cholelithiasis Depression Gastroesophageal Reflux Disease Hemorrhoids     Review of Systems Loretta Hiss M. Loretta Mailloux MD; 04/21/2020 1:08 PM) All other systems negative  Vitals Loretta [name] Loretta [name]; 04/16/2020 2:35 PM) 04/16/2020 2:34 PM Weight: 152.5 lb Height: 62in Body Surface Area: 1.7 m Body Mass Index: 27.89 kg/m  Temp.: 98.81F  Pulse: 102 (Regular)  BP: 120/76(Sitting, Left Arm, Standard)        Physical Exam Loretta Hiss M.  Loretta Leete MD; 04/21/2020 1:08 PM)  General Mental  Status-Alert. General Appearance-Consistent with stated age. Hydration-Well hydrated. Voice-Normal.  Head and Neck Head-normocephalic, atraumatic with no lesions or palpable masses. Trachea-midline. Thyroid Gland Characteristics - normal size and consistency.  Eye Eyeball - Bilateral-Extraocular movements intact. Sclera/Conjunctiva - Bilateral-No scleral icterus.  ENMT Ears -Note:nml ext ears.  Mouth and Throat -Note:lips intact.   Chest and Lung Exam Chest and lung exam reveals -quiet, even and easy respiratory effort with no use of accessory muscles and on auscultation, normal breath sounds, no adventitious sounds and normal vocal resonance. Inspection Chest Wall - Normal. Back - normal.  Breast - Did not examine.  Cardiovascular Cardiovascular examination reveals -normal heart sounds, regular rate and rhythm with no murmurs and normal pedal pulses bilaterally.  Abdomen Inspection Inspection of the abdomen reveals - No Hernias. Skin - Scar - no surgical scars. Palpation/Percussion Palpation and Percussion of the abdomen reveal - Soft, Non Tender, No Rebound tenderness, No Rigidity (guarding) and No hepatosplenomegaly. Auscultation Auscultation of the abdomen reveals - Bowel sounds normal.  Peripheral Vascular Upper Extremity Palpation - Pulses bilaterally normal.  Neurologic Neurologic evaluation reveals -alert and oriented x 3 with no impairment of recent or remote memory. Mental Status-Normal.  Neuropsychiatric The patient's mood and affect are described as -normal. Judgment and Insight-insight is appropriate concerning matters relevant to self.  Musculoskeletal Normal Exam - Left-Upper Extremity Strength Normal and Lower Extremity Strength Normal. Normal Exam - Right-Upper Extremity Strength Normal and Lower Extremity Strength Normal.  Lymphatic Head & Neck  General Head & Neck Lymphatics: Bilateral - Description -  Normal. Axillary - Did not examine. Femoral & Inguinal - Did not examine.    Assessment & Plan (Loretta Ciolino M. Magaret Justo MD; 04/21/2020 1:11 PM)  SYMPTOMATIC CHOLELITHIASIS (K80.20) Impression: I believe the patient's symptoms are consistent with gallbladder disease.  We rediscussed gallbladder disease. The patient was given educational material in spanish. We discussed non-operative and operative management. We discussed the signs & symptoms of acute cholecystitis  I rediscussed laparoscopic/robotic cholecystectomy with IOC in detail using the interpreter. The patient was given educational material as well as diagrams detailing the procedure. We discussed the risks and benefits of a laparoscopic cholecystectomy including, but not limited to bleeding, infection, injury to surrounding structures such as the intestine or liver, bile leak, retained gallstones, need to convert to an open procedure, prolonged diarrhea, blood clots such as DVT, common bile duct injury, anesthesia risks, and possible need for additional procedures. We discussed the typical post-operative recovery course. I explained that the likelihood of improvement of their symptoms is good. i did offer a rx of a limited number of pain pills for the GB attacks.  The patient has elected to proceed with surgery.  This patient encounter took 27 minutes on day of visit to perform the following: take history, perform exam, review outside records, interpret imaging, counsel the patient on their diagnosis.  Current Plans Pt Education - Pamphlet Given - Laparoscopic Gallbladder Surgery: discussed with patient and provided information. You are being scheduled for surgery- Our schedulers will call you.  You should hear from our office's scheduling department within 5 working days about the location, date, and time of surgery. We try to make accommodations for patient's preferences in scheduling surgery, but sometimes the OR schedule or the surgeon's  schedule prevents us from making those accommodations.  If you have not heard from our office ([phone]) in 5 working days, call the office and ask for your surgeon's nurse.    If you have other questions about your diagnosis, plan, or surgery, call the office and ask for your surgeon's nurse.  Started traMADol HCl 50 MG Oral Tablet, 1 (one) Tablet every eight hours, as needed, #6, 04/16/2020, No Refill.  Loretta [name]. Redmond Pulling, MD, FACS General, Bariatric, & Minimally Invasive Surgery [facility], [place]

## 2020-04-29 NOTE — Patient Instructions (Addendum)
DUE TO COVID-19 ONLY ONE VISITOR IS ALLOWED TO COME WITH YOU AND STAY IN THE WAITING ROOM ONLY DURING PRE OP AND PROCEDURE DAY OF SURGERY. THE 1 VISITOR MAY VISIT WITH YOU AFTER SURGERY IN YOUR PRIVATE ROOM DURING VISITING HOURS ONLY!  YOU NEED TO HAVE A COVID 19 TEST ON: 05/04/20 @ 8:40 am , THIS TEST MUST BE DONE BEFORE SURGERY, COME  [address], [place] , [address].  [facility]) ONCE YOUR COVID TEST IS COMPLETED, PLEASE BEGIN THE QUARANTINE INSTRUCTIONS AS OUTLINED IN YOUR HANDOUT.                Loretta [name]    Your procedure is scheduled on: 05/07/20   Report to [facility] Main  Entrance   Report to admitting at: 12:30 PM     Call this number if you have problems the morning of surgery [phone]    Remember: Do not eat solid food :After Midnight. Clear liquids from midnight until: 11:30 am.    CLEAR LIQUID DIET   Foods Allowed                                                                     Foods Excluded  Coffee and tea, regular and decaf                             liquids that you cannot  Plain Jell-O any favor except red or purple                                           see through such as: Fruit ices (not with fruit pulp)                                     milk, soups, orange juice  Iced Popsicles                                    All solid food Carbonated beverages, regular and diet                                    Cranberry, grape and apple juices Sports drinks like Gatorade Lightly seasoned clear broth or consume(fat free) Sugar, honey syrup  Sample Menu Breakfast                                Lunch                                     Supper Cranberry juice                    Beef broth  Chicken broth Jell-O                                     Grape juice                           Apple juice Coffee or tea                        Jell-O                                       Popsicle                                                Coffee or tea                        Coffee or tea  _____________________________________________________________________  BRUSH YOUR TEETH MORNING OF SURGERY AND RINSE YOUR MOUTH OUT, NO CHEWING GUM CANDY OR MINTS.                                 You may not have any metal on your body including hair pins and              piercings  Do not wear jewelry, make-up, lotions, powders or perfumes, deodorant             Do not wear nail polish on your fingernails.  Do not shave  48 hours prior to surgery.             Do not bring valuables to the hospital. [facility] IS NOT             RESPONSIBLE   FOR VALUABLES.  Contacts, dentures or bridgework may not be worn into surgery.  Leave suitcase in the car. After surgery it may be brought to your room.     Patients discharged the day of surgery will not be allowed to drive home. IF YOU ARE HAVING SURGERY AND GOING HOME THE SAME DAY, YOU MUST HAVE AN ADULT TO DRIVE YOU HOME AND BE WITH YOU FOR 24 HOURS. YOU MAY GO HOME BY TAXI OR UBER OR ORTHERWISE, BUT AN ADULT MUST ACCOMPANY YOU HOME AND STAY WITH YOU FOR 24 HOURS.  Name and phone number of your driver:  Special Instructions: N/A              Please read over the following fact sheets you were given: _____________________________________________________________________         [facility] - Preparing for Surgery Before surgery, you can play an important role.  Because skin is not sterile, your skin needs to be as free of germs as possible.  You can reduce the number of germs on your skin by washing with CHG (chlorahexidine gluconate) soap before surgery.  CHG is an antiseptic cleaner which kills germs and bonds with the skin to continue killing germs even after washing. Please DO NOT use if you have an allergy to CHG or antibacterial soaps.  If your skin becomes  reddened/irritated stop using the CHG and inform your nurse when you arrive at  Short Stay. Do not shave (including legs and underarms) for at least 48 hours prior to the first CHG shower.  You may shave your face/neck. Please follow these instructions carefully:  1.  Shower with CHG Soap the night before surgery and the  morning of Surgery.  2.  If you choose to wash your hair, wash your hair first as usual with your  normal  shampoo.  3.  After you shampoo, rinse your hair and body thoroughly to remove the  shampoo.                           4.  Use CHG as you would any other liquid soap.  You can apply chg directly  to the skin and wash                       Gently with a scrungie or clean washcloth.  5.  Apply the CHG Soap to your body ONLY FROM THE NECK DOWN.   Do not use on face/ open                           Wound or open sores. Avoid contact with eyes, ears mouth and genitals (private parts).                       Wash face,  Genitals (private parts) with your normal soap.             6.  Wash thoroughly, paying special attention to the area where your surgery  will be performed.  7.  Thoroughly rinse your body with warm water from the neck down.  8.  DO NOT shower/wash with your normal soap after using and rinsing off  the CHG Soap.                9.  Pat yourself dry with a clean towel.            10.  Wear clean pajamas.            11.  Place clean sheets on your bed the night of your first shower and do not  sleep with pets. Day of Surgery : Do not apply any lotions/deodorants the morning of surgery.  Please wear clean clothes to the hospital/surgery center.  FAILURE TO FOLLOW THESE INSTRUCTIONS MAY RESULT IN THE CANCELLATION OF YOUR SURGERY PATIENT SIGNATURE_________________________________  NURSE SIGNATURE__________________________________  ________________________________________________________________________

## 2020-04-30 ENCOUNTER — Encounter (HOSPITAL_COMMUNITY): Payer: Self-pay

## 2020-04-30 ENCOUNTER — Other Ambulatory Visit: Payer: Self-pay

## 2020-04-30 ENCOUNTER — Encounter (HOSPITAL_COMMUNITY)
Admission: RE | Admit: 2020-04-30 | Discharge: 2020-04-30 | Disposition: A | Discharge status: Home / Self Care | Payer: Medicaid Other | Source: Ambulatory Visit | Attending: General Surgery | Admitting: General Surgery

## 2020-04-30 DIAGNOSIS — Z01818 Encounter for other preprocedural examination: Secondary | ICD-10-CM | POA: Insufficient documentation

## 2020-04-30 LAB — CBC
HCT: 42.5 % (ref 36.0–46.0)
Hemoglobin: 13.9 g/dL (ref 12.0–15.0)
MCH: 28.5 pg (ref 26.0–34.0)
MCHC: 32.7 g/dL (ref 30.0–36.0)
MCV: 87.1 fL (ref 80.0–100.0)
Platelets: 375 10*3/uL (ref 150–400)
RBC: 4.88 MIL/uL (ref 3.87–5.11)
RDW: 13.1 % (ref 11.5–15.5)
WBC: 6.5 10*3/uL (ref 4.0–10.5)
nRBC: 0 % (ref 0.0–0.2)

## 2020-04-30 NOTE — Progress Notes (Signed)
COVID Vaccine Completed:yes Date COVID Vaccine completed:01/29/20 COVID vaccine manufacturer: *[employer]    [facility] [employer]   PCP - Dr. Jackelyn Poling. LOV: 10/29/19 Cardiologist -   Chest x-ray -  EKG -  Stress Test -  ECHO -  Cardiac Cath -   Sleep Study -  CPAP -   Fasting Blood Sugar -  Checks Blood Sugar _____ times a day  Blood Thinner Instructions: Aspirin Instructions: Last Dose:  Anesthesia review:   Patient denies shortness of breath, fever, cough and chest pain at PAT appointment   Patient verbalized understanding of instructions that were given to them at the PAT appointment. Patient was also instructed that they will need to review over the PAT instructions again at home before surgery.

## 2020-05-04 ENCOUNTER — Other Ambulatory Visit (HOSPITAL_COMMUNITY): Payer: Medicaid Other

## 2020-05-04 ENCOUNTER — Other Ambulatory Visit (HOSPITAL_COMMUNITY)
Admission: RE | Admit: 2020-05-04 | Discharge: 2020-05-04 | Disposition: A | Discharge status: Home / Self Care | Payer: Medicaid Other | Source: Ambulatory Visit | Attending: General Surgery | Admitting: General Surgery

## 2020-05-04 DIAGNOSIS — Z20822 Contact with and (suspected) exposure to covid-19: Secondary | ICD-10-CM | POA: Insufficient documentation

## 2020-05-04 DIAGNOSIS — Z01812 Encounter for preprocedural laboratory examination: Secondary | ICD-10-CM | POA: Insufficient documentation

## 2020-05-04 LAB — SARS CORONAVIRUS 2 (TAT 6-24 HRS): SARS Coronavirus 2: NEGATIVE

## 2020-05-06 ENCOUNTER — Encounter (HOSPITAL_COMMUNITY): Payer: Self-pay | Admitting: General Surgery

## 2020-05-06 NOTE — Anesthesia Preprocedure Evaluation (Addendum)
Anesthesia Evaluation  Patient identified by MRN, date of birth, ID band Patient awake    Reviewed: Allergy & Precautions, NPO status , Patient's Chart, lab work & pertinent test results  Airway Mallampati: II  TM Distance: >3 FB Neck ROM: Full    Dental no notable dental hx. (+) Teeth Intact, Dental Advisory Given   Pulmonary neg pulmonary ROS,    Pulmonary exam normal breath sounds clear to auscultation       Cardiovascular negative cardio ROS Normal cardiovascular exam Rhythm:Regular Rate:Normal     Neuro/Psych negative neurological ROS     GI/Hepatic Neg liver ROS, GERD  ,  Endo/Other  negative endocrine ROS  Renal/GU negative Renal ROS     Musculoskeletal negative musculoskeletal ROS (+)   Abdominal   Peds  Hematology   Anesthesia Other Findings   Reproductive/Obstetrics negative OB ROS                            Anesthesia Physical Anesthesia Plan  ASA: II  Anesthesia Plan: General   Post-op Pain Management:    Induction: Intravenous  PONV Risk Score and Plan: 4 or greater and Treatment may vary due to age or medical condition, Ondansetron, Dexamethasone and Midazolam  Airway Management Planned: Oral ETT  Additional Equipment: None  Intra-op Plan:   Post-operative Plan: Extubation in OR  Informed Consent:     Dental advisory given and Interpreter used for interveiw  Plan Discussed with:   Anesthesia Plan Comments:        Anesthesia Quick Evaluation

## 2020-05-07 ENCOUNTER — Ambulatory Visit (HOSPITAL_COMMUNITY)
Admission: RE | Admit: 2020-05-07 | Discharge: 2020-05-07 | Disposition: A | Discharge status: Home / Self Care | Payer: Medicaid Other | Attending: General Surgery | Admitting: General Surgery

## 2020-05-07 ENCOUNTER — Encounter (HOSPITAL_COMMUNITY): Payer: Self-pay | Admitting: General Surgery

## 2020-05-07 ENCOUNTER — Other Ambulatory Visit: Payer: Self-pay

## 2020-05-07 ENCOUNTER — Encounter (HOSPITAL_COMMUNITY)
Admission: RE | Disposition: A | Discharge status: Home / Self Care | Payer: Self-pay | Source: Home / Self Care | Attending: General Surgery

## 2020-05-07 ENCOUNTER — Ambulatory Visit (HOSPITAL_COMMUNITY): Payer: Medicaid Other | Admitting: Anesthesiology

## 2020-05-07 DIAGNOSIS — K8010 Calculus of gallbladder with chronic cholecystitis without obstruction: Secondary | ICD-10-CM | POA: Insufficient documentation

## 2020-05-07 DIAGNOSIS — F39 Unspecified mood [affective] disorder: Secondary | ICD-10-CM | POA: Diagnosis not present

## 2020-05-07 DIAGNOSIS — K828 Other specified diseases of gallbladder: Secondary | ICD-10-CM | POA: Insufficient documentation

## 2020-05-07 DIAGNOSIS — Z79899 Other long term (current) drug therapy: Secondary | ICD-10-CM | POA: Insufficient documentation

## 2020-05-07 DIAGNOSIS — F329 Major depressive disorder, single episode, unspecified: Secondary | ICD-10-CM | POA: Insufficient documentation

## 2020-05-07 DIAGNOSIS — K8020 Calculus of gallbladder without cholecystitis without obstruction: Secondary | ICD-10-CM | POA: Diagnosis present

## 2020-05-07 DIAGNOSIS — K219 Gastro-esophageal reflux disease without esophagitis: Secondary | ICD-10-CM | POA: Diagnosis not present

## 2020-05-07 DIAGNOSIS — Z302 Encounter for sterilization: Secondary | ICD-10-CM | POA: Diagnosis not present

## 2020-05-07 DIAGNOSIS — K8070 Calculus of gallbladder and bile duct without cholecystitis without obstruction: Secondary | ICD-10-CM | POA: Diagnosis not present

## 2020-05-07 HISTORY — PX: LAPAROSCOPIC CHOLECYSTECTOMY: SUR755

## 2020-05-07 LAB — PREGNANCY, URINE: Preg Test, Ur: NEGATIVE

## 2020-05-07 SURGERY — CHOLECYSTECTOMY, ROBOT-ASSISTED, LAPAROSCOPIC
Anesthesia: General | Site: Abdomen

## 2020-05-07 MED ORDER — SUCCINYLCHOLINE CHLORIDE 20 MG/ML IJ SOLN
INTRAMUSCULAR | Status: DC | PRN
  Administered 2020-05-07: 100 mg via INTRAVENOUS

## 2020-05-07 MED ORDER — LIDOCAINE 2% (20 MG/ML) 5 ML SYRINGE
INTRAMUSCULAR | Status: DC | PRN
  Administered 2020-05-07: 80 mg via INTRAVENOUS

## 2020-05-07 MED ORDER — HYDROMORPHONE HCL 1 MG/ML IJ SOLN
INTRAMUSCULAR | Status: AC
  Filled 2020-05-07: qty 1

## 2020-05-07 MED ORDER — BUPIVACAINE-EPINEPHRINE 0.25% -1:200000 IJ SOLN
INTRAMUSCULAR | Status: DC | PRN
  Administered 2020-05-07: 47 mL

## 2020-05-07 MED ORDER — DROPERIDOL 2.5 MG/ML IJ SOLN: 0.6250 mg | Freq: Once | INTRAMUSCULAR | Status: DC | PRN

## 2020-05-07 MED ORDER — PROPOFOL 10 MG/ML IV BOLUS
INTRAVENOUS | Status: DC | PRN
  Administered 2020-05-07: 140 mg via INTRAVENOUS

## 2020-05-07 MED ORDER — INDOCYANINE GREEN 25 MG IV SOLR
7.5000 mg | Freq: Once | INTRAVENOUS | Status: AC
  Administered 2020-05-07: 7.5 mg via INTRAVENOUS
  Filled 2020-05-07: qty 3

## 2020-05-07 MED ORDER — BUPIVACAINE-EPINEPHRINE (PF) 0.25% -1:200000 IJ SOLN
INTRAMUSCULAR | Status: AC
  Filled 2020-05-07: qty 30

## 2020-05-07 MED ORDER — BUPIVACAINE-EPINEPHRINE 0.25% -1:200000 IJ SOLN
INTRAMUSCULAR | Status: AC
  Filled 2020-05-07: qty 1

## 2020-05-07 MED ORDER — 0.9 % SODIUM CHLORIDE (POUR BTL) OPTIME
TOPICAL | Status: DC | PRN
  Administered 2020-05-07: 1000 mL

## 2020-05-07 MED ORDER — CHLORHEXIDINE GLUCONATE CLOTH 2 % EX PADS: 6.0000 | MEDICATED_PAD | Freq: Once | CUTANEOUS | Status: DC

## 2020-05-07 MED ORDER — ONDANSETRON HCL 4 MG/2ML IJ SOLN
INTRAMUSCULAR | Status: DC | PRN
  Administered 2020-05-07: 4 mg via INTRAVENOUS

## 2020-05-07 MED ORDER — OXYCODONE HCL 5 MG PO TABS: 5.0000 mg | ORAL_TABLET | Freq: Once | ORAL | Status: DC | PRN

## 2020-05-07 MED ORDER — SODIUM CHLORIDE 0.9 % IV SOLN
2.0000 g | INTRAVENOUS | Status: AC
  Administered 2020-05-07: 2 g via INTRAVENOUS
  Filled 2020-05-07: qty 2

## 2020-05-07 MED ORDER — LIDOCAINE HCL 2 % IJ SOLN
INTRAMUSCULAR | Status: AC
  Filled 2020-05-07: qty 60

## 2020-05-07 MED ORDER — KETOROLAC TROMETHAMINE 30 MG/ML IJ SOLN: 30.0000 mg | Freq: Once | INTRAMUSCULAR | Status: DC | PRN

## 2020-05-07 MED ORDER — LACTATED RINGERS IV SOLN: INTRAVENOUS | Status: DC

## 2020-05-07 MED ORDER — MIDAZOLAM HCL 5 MG/5ML IJ SOLN
INTRAMUSCULAR | Status: DC | PRN
  Administered 2020-05-07: 2 mg via INTRAVENOUS

## 2020-05-07 MED ORDER — SUGAMMADEX SODIUM 200 MG/2ML IV SOLN
INTRAVENOUS | Status: DC | PRN
  Administered 2020-05-07: 200 mg via INTRAVENOUS

## 2020-05-07 MED ORDER — FENTANYL CITRATE (PF) 100 MCG/2ML IJ SOLN
INTRAMUSCULAR | Status: DC | PRN
  Administered 2020-05-07 (×2): 50 ug via INTRAVENOUS
  Administered 2020-05-07: 100 ug via INTRAVENOUS
  Administered 2020-05-07 (×3): 50 ug via INTRAVENOUS

## 2020-05-07 MED ORDER — GABAPENTIN 300 MG PO CAPS
300.0000 mg | ORAL_CAPSULE | ORAL | Status: AC
  Administered 2020-05-07: 300 mg via ORAL
  Filled 2020-05-07: qty 1

## 2020-05-07 MED ORDER — ORAL CARE MOUTH RINSE: 15.0000 mL | Freq: Once | OROMUCOSAL | Status: AC

## 2020-05-07 MED ORDER — OXYCODONE HCL 5 MG PO TABS: 5.0000 mg | ORAL_TABLET | Freq: Four times a day (QID) | ORAL | 0 refills | Status: DC | PRN

## 2020-05-07 MED ORDER — LIDOCAINE 2% (20 MG/ML) 5 ML SYRINGE
INTRAMUSCULAR | Status: DC | PRN
Start: 2020-05-07 — End: 2020-05-07
  Administered 2020-05-07: 3.3 mL/h via INTRAVENOUS

## 2020-05-07 MED ORDER — ACETAMINOPHEN 500 MG PO TABS
1000.0000 mg | ORAL_TABLET | ORAL | Status: AC
  Administered 2020-05-07: 1000 mg via ORAL
  Filled 2020-05-07: qty 2

## 2020-05-07 MED ORDER — FENTANYL CITRATE (PF) 250 MCG/5ML IJ SOLN
INTRAMUSCULAR | Status: AC
  Filled 2020-05-07: qty 5

## 2020-05-07 MED ORDER — CHLORHEXIDINE GLUCONATE 0.12 % MT SOLN
15.0000 mL | Freq: Once | OROMUCOSAL | Status: AC
  Administered 2020-05-07: 15 mL via OROMUCOSAL

## 2020-05-07 MED ORDER — HYDROMORPHONE HCL 1 MG/ML IJ SOLN
0.2500 mg | INTRAMUSCULAR | Status: DC | PRN
  Administered 2020-05-07 (×3): 0.5 mg via INTRAVENOUS

## 2020-05-07 MED ORDER — DEXMEDETOMIDINE HCL 200 MCG/2ML IV SOLN
INTRAVENOUS | Status: DC | PRN
  Administered 2020-05-07: 4 ug via INTRAVENOUS
  Administered 2020-05-07: 12 ug via INTRAVENOUS
  Administered 2020-05-07: 8 ug via INTRAVENOUS

## 2020-05-07 MED ORDER — ROCURONIUM BROMIDE 10 MG/ML (PF) SYRINGE
PREFILLED_SYRINGE | INTRAVENOUS | Status: DC | PRN
  Administered 2020-05-07: 10 mg via INTRAVENOUS
  Administered 2020-05-07: 40 mg via INTRAVENOUS

## 2020-05-07 MED ORDER — ONDANSETRON HCL 4 MG/2ML IJ SOLN: 4.0000 mg | Freq: Once | INTRAMUSCULAR | Status: DC | PRN

## 2020-05-07 MED ORDER — MIDAZOLAM HCL 2 MG/2ML IJ SOLN
INTRAMUSCULAR | Status: AC
  Filled 2020-05-07: qty 2

## 2020-05-07 MED ORDER — OXYCODONE HCL 5 MG/5ML PO SOLN: 5.0000 mg | Freq: Once | ORAL | Status: DC | PRN

## 2020-05-07 MED ORDER — DEXAMETHASONE SODIUM PHOSPHATE 10 MG/ML IJ SOLN
INTRAMUSCULAR | Status: DC | PRN
  Administered 2020-05-07: 10 mg via INTRAVENOUS

## 2020-05-07 MED ORDER — KETOROLAC TROMETHAMINE 15 MG/ML IJ SOLN
INTRAMUSCULAR | Status: DC | PRN
  Administered 2020-05-07: 15 mg via INTRAVENOUS

## 2020-05-07 MED ORDER — LACTATED RINGERS IR SOLN
Status: DC | PRN
  Administered 2020-05-07: 1000 mL

## 2020-05-07 MED ORDER — ACETAMINOPHEN 500 MG PO TABS
1000.0000 mg | ORAL_TABLET | Freq: Three times a day (TID) | ORAL | 0 refills | Status: AC
Start: 2020-05-07 — End: 2020-05-12

## 2020-05-07 MED ORDER — FENTANYL CITRATE (PF) 100 MCG/2ML IJ SOLN
INTRAMUSCULAR | Status: AC
  Filled 2020-05-07: qty 2

## 2020-05-07 SURGICAL SUPPLY — 59 items
APPLIER CLIP 5 13 M/L LIGAMAX5 (MISCELLANEOUS)
BLADE SURG SZ11 CARB STEEL (BLADE) ×3 IMPLANT
BNDG ADH 1X3 SHEER STRL LF (GAUZE/BANDAGES/DRESSINGS) ×12 IMPLANT
CANNULA REDUC XI 12-8 STAPL (CANNULA) ×1
CANNULA REDUC XI 12-8MM STAPL (CANNULA) ×1
CANNULA REDUCER 12-8 DVNC XI (CANNULA) ×1 IMPLANT
CHLORAPREP W/TINT 26 (MISCELLANEOUS) ×3 IMPLANT
CLIP APPLIE 5 13 M/L LIGAMAX5 (MISCELLANEOUS) IMPLANT
CLIP VESOLOCK LG 6/CT PURPLE (CLIP) ×3 IMPLANT
CLOSURE STERI-STRIP 1/2X4 (GAUZE/BANDAGES/DRESSINGS) ×1
CLOSURE WOUND 1/2 X4 (GAUZE/BANDAGES/DRESSINGS) ×1
CLSR STERI-STRIP ANTIMIC 1/2X4 (GAUZE/BANDAGES/DRESSINGS) ×2 IMPLANT
COVER TIP SHEARS 8 DVNC (MISCELLANEOUS) IMPLANT
COVER TIP SHEARS 8MM DA VINCI (MISCELLANEOUS)
COVER WAND RF STERILE (DRAPES) ×3 IMPLANT
DECANTER SPIKE VIAL GLASS SM (MISCELLANEOUS) ×3 IMPLANT
DRAPE ARM DVNC X/XI (DISPOSABLE) ×4 IMPLANT
DRAPE COLUMN DVNC XI (DISPOSABLE) ×1 IMPLANT
DRAPE DA VINCI XI ARM (DISPOSABLE) ×8
DRAPE DA VINCI XI COLUMN (DISPOSABLE) ×2
DRSG TEGADERM 2-3/8X2-3/4 SM (GAUZE/BANDAGES/DRESSINGS) ×3 IMPLANT
DRSG TEGADERM 4X4.75 (GAUZE/BANDAGES/DRESSINGS) ×3 IMPLANT
ELECT REM PT RETURN 15FT ADLT (MISCELLANEOUS) ×3 IMPLANT
GAUZE SPONGE 2X2 8PLY STRL LF (GAUZE/BANDAGES/DRESSINGS) ×1 IMPLANT
GLOVE BIO SURGEON STRL SZ7.5 (GLOVE) ×6 IMPLANT
GLOVE INDICATOR 8.0 STRL GRN (GLOVE) ×6 IMPLANT
GOWN STRL REUS W/TWL XL LVL3 (GOWN DISPOSABLE) ×6 IMPLANT
GRASPER SUT TROCAR 14GX15 (MISCELLANEOUS) IMPLANT
IRRIGATOR SUCT 8 DISP DVNC XI (IRRIGATION / IRRIGATOR) IMPLANT
IRRIGATOR SUCTION 8MM XI DISP (IRRIGATION / IRRIGATOR)
KIT BASIN OR (CUSTOM PROCEDURE TRAY) ×3 IMPLANT
KIT PROCEDURE DA VINCI SI (MISCELLANEOUS)
KIT PROCEDURE DVNC SI (MISCELLANEOUS) IMPLANT
KIT TURNOVER KIT A (KITS) IMPLANT
MANIFOLD NEPTUNE II (INSTRUMENTS) ×3 IMPLANT
NEEDLE HYPO 22GX1.5 SAFETY (NEEDLE) ×3 IMPLANT
NEEDLE INSUFFLATION 14GA 120MM (NEEDLE) ×3 IMPLANT
PACK CARDIOVASCULAR III (CUSTOM PROCEDURE TRAY) ×3 IMPLANT
POUCH RETRIEVAL ECOSAC 10 (ENDOMECHANICALS) ×1 IMPLANT
POUCH RETRIEVAL ECOSAC 10MM (ENDOMECHANICALS) ×2
SEAL CANN UNIV 5-8 DVNC XI (MISCELLANEOUS) ×4 IMPLANT
SEAL XI 5MM-8MM UNIVERSAL (MISCELLANEOUS) ×8
SET IRRIG TUBING LAPAROSCOPIC (IRRIGATION / IRRIGATOR) IMPLANT
SOL ANTI FOG 6CC (MISCELLANEOUS) ×1 IMPLANT
SOLUTION ANTI FOG 6CC (MISCELLANEOUS) ×2
SOLUTION ELECTROLUBE (MISCELLANEOUS) ×3 IMPLANT
SPONGE GAUZE 2X2 STER 10/PKG (GAUZE/BANDAGES/DRESSINGS) ×2
SPONGE LAP 18X18 RF (DISPOSABLE) ×3 IMPLANT
STAPLER CANNULA SEAL DVNC XI (STAPLE) IMPLANT
STAPLER CANNULA SEAL XI (STAPLE)
STRIP CLOSURE SKIN 1/2X4 (GAUZE/BANDAGES/DRESSINGS) ×2 IMPLANT
SUT MNCRL AB 4-0 PS2 18 (SUTURE) ×3 IMPLANT
SYR 10ML ECCENTRIC (SYRINGE) ×3 IMPLANT
SYR 20ML LL LF (SYRINGE) ×3 IMPLANT
TOWEL OR 17X26 10 PK STRL BLUE (TOWEL DISPOSABLE) ×3 IMPLANT
TOWEL OR NON WOVEN STRL DISP B (DISPOSABLE) ×3 IMPLANT
TRAY FOLEY MTR SLVR 16FR STAT (SET/KITS/TRAYS/PACK) IMPLANT
TROCAR BLADELESS OPT 5 100 (ENDOMECHANICALS) IMPLANT
TUBING INSUFFLATION 10FT LAP (TUBING) ×3 IMPLANT

## 2020-05-07 NOTE — Op Note (Signed)
Robotic/Xi Cholecystectomy with Firefly immunofluorescence procedure Note   Indications: This patient presents with symptomatic gallbladder disease and will undergo laparoscopic cholecystectomy.  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 The risks, benefits, complications, treatment options, and expected outcomes were discussed with the patient. The possibilities of reaction to medication, pulmonary aspiration, perforation of viscus, bleeding, recurrent infection, finding a normal gallbladder, the need for additional procedures, failure to diagnose a condition, the possible need to convert to an open procedure, and creating a complication requiring transfusion or operation were discussed with the patient. The likelihood of improving the patient's symptoms with return to their baseline status is good.  The patient and/or family concurred with the proposed plan, giving informed consent. The site of surgery properly noted. The patient was taken to Operating Room, identified as Loretta [name]  and the procedure verified as robotic Cholecystectomy. A Time Out was held and the above information confirmed. Antibiotic prophylaxis was administered.    Prior to the induction of general anesthesia, antibiotic prophylaxis was administered. General endotracheal anesthesia was then administered and tolerated well. After the induction, the abdomen was prepped with Chloraprep and draped in the sterile fashion. The patient was positioned in the supine position.   Local anesthetic agent was injected into the skin near the umbilicus and an incision made. We dissected down to the abdominal fascia with blunt dissection.  The fascia was incised  vertically and we entered the peritoneal cavity bluntly.  A pursestring suture of 0-Vicryl was placed around the fascial opening.  A 3mm robotic Hasson cannula was inserted and secured with the stay suture.  Pneumoperitoneum was then created with CO2 and tolerated well without any adverse changes in the patient's vital signs.  The robotic camera was inserted and the abdominal cavity was surveilled.  There is no evidence of injury to surrounding structures.  I then placed three additional 8 mm robotic trochars in a horizontal line in the lower abdomen.  Two trochars were placed on the right side of the abdomen and one in the left lower quadrant all under direct visualization.  I then infiltrated local along the lateral abdominal walls as a tap block.     We positioned the patient in reverse Trendelenburg, tilted slightly to the patient's left.    We then brought in the [employer] I robot and deployed it for upper abdominal surgery from patient's right.  The robotic camera was inserted and the anatomy was targeted.  The robotic arms were attached to the robotic trochars.  A forced bipolar, prograsp, and robotic hook were inserted under direct visualization.   I then scrubbed out and went to the robotic console while the scrub technician stayed at the bedside.    The gallbladder was identified, the fundus grasped and retracted cephalad. Adhesions were lysed bluntly and with the electrocautery where indicated, taking care not to injure any adjacent organs or viscus. The infundibulum was grasped and retracted laterally, exposing the peritoneum overlying the triangle of Calot. This was then divided and exposed in a blunt fashion.  Firefly immunofluorescent was turned on several times during the procedure as a secondary confirmation of the location of the cystic duct and common bile duct.  There was no obvious filling defect in the cystic duct  or common bile duct.  A critical view of the cystic duct and cystic  artery was obtained.  The cystic duct was clearly identified and bluntly dissected circumferentially.  My assistant performed instrument exchanges between the hook and the robotic hemoclips   The cystic duct was then ligated with clips and divided. The cystic artery  which had been identified & dissected free was ligated with clips and divided as well.    The gallbladder was dissected from the liver bed in retrograde fashion with the electrocautery.  There was some spillage of bile from the gallbladder.  There was 1 stone that spilled.  The hook was removed and the assistant inserted a  Endo Catch bag & the gallbladder & stone  was removed and placed in an Endo Catch bag. The liver bed was irrigated and inspected. Hemostasis was achieved with the electrocautery. Copious irrigation was utilized and was repeatedly aspirated until clear. There was no other evidence of spilled gallstones. The robot was undocked.  I scrubbed back in. The gallbladder and Endo Catch bag were then removed through the umbilical port site.      the pursestring suture was used to close the umbilical fascia.  I did place an additional interrupted 0 Vicryl at the umbilical fascia using a PMI suture passer   We again inspected the right upper quadrant for hemostasis.  The umbilical closure was inspected and there was no air leak and nothing trapped within the closure. Pneumoperitoneum was released as we removed the trocars.  4-0 Monocryl was used to close the skin.   steri-strips, and clean dressings were applied. The patient was then extubated and brought to the recovery room in stable condition. Instrument, sponge, and needle counts were correct at closure and at the conclusion of the case.    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         Disposition:  PACU - hemodynamically stable.         Condition: stable  Loretta [name]. Andrey Campanile, MD, FACS General, Bariatric, & Minimally Invasive Surgery [facility], [place]

## 2020-05-07 NOTE — Discharge Instructions (Signed)
[facility] LAPAROSCOPIC SURGERY: POST OP INSTRUCTIONS Always review your discharge instruction sheet given to you by the facility where your surgery was performed. IF YOU HAVE DISABILITY OR FAMILY LEAVE FORMS, YOU MUST BRING THEM TO THE OFFICE FOR PROCESSING.   DO NOT GIVE THEM TO YOUR DOCTOR.  PAIN CONTROL  1. First take acetaminophen (Tylenol) AND/or ibuprofen (Advil) to control your pain after surgery.  Follow directions on package.  Taking acetaminophen (Tylenol) and/or ibuprofen (Advil) regularly after surgery will help to control your pain and lower the amount of prescription pain medication you may need.  You should not take more than 3,000 mg (3 grams) of acetaminophen (Tylenol) in 24 hours.  You should not take ibuprofen (Advil), aleve, motrin, naprosyn or other NSAIDS if you have a history of stomach ulcers or chronic kidney disease.  2. A prescription for pain medication may be given to you upon discharge.  Take your pain medication as prescribed, if you still have uncontrolled pain after taking acetaminophen (Tylenol) or ibuprofen (Advil). 3. Use ice packs to help control pain. 4. If you need a refill on your pain medication, please contact your pharmacy.  They will contact our office to request authorization. Prescriptions will not be filled after 5pm or on week-ends.  HOME MEDICATIONS 5. Take your usually prescribed medications unless otherwise directed.  DIET 6. You should follow a light diet the first few days after arrival home.  Be sure to include lots of fluids daily. Avoid fatty, fried foods.   CONSTIPATION 7. It is common to experience some constipation after surgery and if you are taking pain medication.  Increasing fluid intake and taking a stool softener (such as Colace) will usually help or prevent this problem from occurring.  A mild laxative (Milk of Magnesia or Miralax) should be taken according to package instructions if there are no bowel  movements after 48 hours.  WOUND/INCISION CARE 8. Most patients will experience some swelling and bruising in the area of the incisions.  Ice packs will help.  Swelling and bruising can take several days to resolve.  9. Unless discharge instructions indicate otherwise, follow guidelines below  a. STERI-STRIPS - you may remove your outer bandages 48 hours after surgery, and you may shower at that time.  You have steri-strips (small skin tapes) in place directly over the incision.  These strips should be left on the skin for 7-10 days.   b. DERMABOND/SKIN GLUE - you may shower in 24 hours.  The glue will flake off over the next 2-3 weeks. 10. Any sutures or staples will be removed at the office during your follow-up visit.  ACTIVITIES 11. You may resume regular (light) daily activities beginning the next day--such as daily self-care, walking, climbing stairs--gradually increasing activities as tolerated.  You may have sexual intercourse when it is comfortable.  Refrain from any heavy lifting or straining until approved by your doctor. a. You may drive when you are no longer taking prescription pain medication, you can comfortably wear a seatbelt, and you can safely maneuver your car and apply brakes.  FOLLOW-UP 12. You should see your doctor in the office for a follow-up appointment approximately 2-3 weeks after your surgery.  You should have been given your post-op/follow-up appointment when your surgery was scheduled.  If you did not receive a post-op/follow-up appointment, make sure that you call for this appointment within a day or two after you arrive home to insure a convenient appointment time.  OTHER   INSTRUCTIONS 13. Do not lift/push/pull anything greater than 10 lbs for 2 weeks  WHEN TO CALL YOUR DOCTOR: 1. Fever over 101.0 2. Inability to urinate 3. Continued bleeding from incision. 4. Increased pain, redness, or drainage from the incision. 5. Increasing abdominal pain  The clinic  staff is available to answer your questions during regular business hours.  Please don't hesitate to call and ask to speak to one of the nurses for clinical concerns.  If you have a medical emergency, go to the nearest emergency room or call 911.  A surgeon from [facility] is always on call at the hospital. [address], [place], [place]  [address] ? [address] [address], [place], [place]   [address] [phone] ? [phone] ? FAX [phone] Web site: [email or web address]  ........Marland Kitchen   Managing Your Pain After Surgery Without Opioids    Thank you for participating in our program to help patients manage their pain after surgery without opioids. This is part of our effort to provide you with the best care possible, without exposing you or your family to the risk that opioids pose.  What pain can I expect after surgery? You can expect to have some pain after surgery. This is normal. The pain is typically worse the day after surgery, and quickly begins to get better. Many studies have found that many patients are able to manage their pain after surgery with Over-the-Counter (OTC) medications such as Tylenol and Motrin. If you have a condition that does not allow you to take Tylenol or Motrin, notify your surgical team.  How will I manage my pain? The best strategy for controlling your pain after surgery is around the clock pain control with Tylenol (acetaminophen) and Motrin (ibuprofen or Advil). Alternating these medications with each other allows you to maximize your pain control. In addition to Tylenol and Motrin, you can use heating pads or ice packs on your incisions to help reduce your pain.  How will I alternate your regular strength over-the-counter pain medication? You will take a dose of pain medication every three hours. ; Start by taking 650 mg of Tylenol (2 pills of 325 mg) ; 3 hours later take 600 mg of Motrin (3 pills of 200 mg) ; 3 hours  after taking the Motrin take 650 mg of Tylenol ; 3 hours after that take 600 mg of Motrin.   - 1 -  See example - if your first dose of Tylenol is at 12:00 PM   12:00 PM Tylenol 650 mg (2 pills of 325 mg)  3:00 PM Motrin 600 mg (3 pills of 200 mg)  6:00 PM Tylenol 650 mg (2 pills of 325 mg)  9:00 PM Motrin 600 mg (3 pills of 200 mg)  Continue alternating every 3 hours   We recommend that you follow this schedule around-the-clock for at least 3 days after surgery, or until you feel that it is no longer needed. Use the table on the last page of this handout to keep track of the medications you are taking. Important: Do not take more than 3000mg  of Tylenol or 1800mg  of Motrin in a 24-hour period. Do not take ibuprofen/Motrin if you have a history of bleeding stomach ulcers, severe kidney disease, &/or actively taking a blood thinner  What if I still have pain? If you have pain that is not controlled with the over-the-counter pain medications (Tylenol and Motrin or Advil) you might have what we call "breakthrough" pain. You will  receive a prescription for a small amount of an opioid pain medication such as Oxycodone, Tramadol, or Tylenol with Codeine. Use these opioid pills in the first 24 hours after surgery if you have breakthrough pain. Do not take more than 1 pill every 4-6 hours.  If you still have uncontrolled pain after using all opioid pills, don't hesitate to call our staff using the number provided. We will help make sure you are managing your pain in the best way possible, and if necessary, we can provide a prescription for additional pain medication.   Day 1    Time  Name of Medication Number of pills taken  Amount of Acetaminophen  Pain Level   Comments  AM PM       AM PM       AM PM       AM PM       AM PM       AM PM       AM PM       AM PM       Total Daily amount of Acetaminophen Do not take more than  3,000 mg per day      Day 2    Time  Name of  Medication Number of pills taken  Amount of Acetaminophen  Pain Level   Comments  AM PM       AM PM       AM PM       AM PM       AM PM       AM PM       AM PM       AM PM       Total Daily amount of Acetaminophen Do not take more than  3,000 mg per day      Day 3    Time  Name of Medication Number of pills taken  Amount of Acetaminophen  Pain Level   Comments  AM PM       AM PM       AM PM       AM PM          AM PM       AM PM       AM PM       AM PM       Total Daily amount of Acetaminophen Do not take more than  3,000 mg per day      Day 4    Time  Name of Medication Number of pills taken  Amount of Acetaminophen  Pain Level   Comments  AM PM       AM PM       AM PM       AM PM       AM PM       AM PM       AM PM       AM PM       Total Daily amount of Acetaminophen Do not take more than  3,000 mg per day      Day 5    Time  Name of Medication Number of pills taken  Amount of Acetaminophen  Pain Level   Comments  AM PM       AM PM       AM PM       AM PM       AM PM  AM PM       AM PM       AM PM       Total Daily amount of Acetaminophen Do not take more than  3,000 mg per day       Day 6    Time  Name of Medication Number of pills taken  Amount of Acetaminophen  Pain Level  Comments  AM PM       AM PM       AM PM       AM PM       AM PM       AM PM       AM PM       AM PM       Total Daily amount of Acetaminophen Do not take more than  3,000 mg per day      Day 7    Time  Name of Medication Number of pills taken  Amount of Acetaminophen  Pain Level   Comments  AM PM       AM PM       AM PM       AM PM       AM PM       AM PM       AM PM       AM PM       Total Daily amount of Acetaminophen Do not take more than  3,000 mg per day        For additional information about how and where to safely dispose of unused opioid medications - RoleLink.com.br  Disclaimer: This  document contains information and/or instructional materials adapted from [place] for the typical patient with your condition. It does not replace medical advice from your health care provider because your experience may differ from that of the typical patient. Talk to your health care provider if you have any questions about this document, your condition or your treatment plan. Adapted from [place]

## 2020-05-07 NOTE — Interval H&P Note (Signed)
History and Physical Interval Note:  05/07/2020 2:55 PM  Loretta [name]  has presented today for surgery, with the diagnosis of SYMPTOMATIC CHOLELITHIASIS.  The various methods of treatment have been discussed with the patient and family. After consideration of risks, benefits and other options for treatment, the patient has consented to  Procedure(s): XI ROBOTIC ASSISTED LAPAROSCOPIC CHOLECYSTECTOMY WITH INTRAOPERATIVE CHOLANGIOGRAM (N/A) as a surgical intervention.  The patient's history has been reviewed, patient examined, no change in status, stable for surgery.  I have reviewed the patient's chart and labs.  Questions were answered to the patient's satisfaction.    Mary Sella. Andrey Campanile, MD, FACS General, Bariatric, & Minimally Invasive Surgery [facility]  Gaynelle Adu

## 2020-05-07 NOTE — Brief Op Note (Signed)
05/07/2020  5:12 PM  PATIENT:  Loretta [name]  34 y.o. female  PRE-OPERATIVE DIAGNOSIS:  SYMPTOMATIC CHOLELITHIASIS  POST-OPERATIVE DIAGNOSIS:  SYMPTOMATIC CHOLELITHIASIS  PROCEDURE:  Procedure(s): XI ROBOTIC ASSISTED LAPAROSCOPIC CHOLECYSTECTOMY (N/A)  SURGEON:  Surgeon(s) and Role:    Gaynelle Adu, MD - Primary  PHYSICIAN ASSISTANT:   ASSISTANTS: none   ANESTHESIA:   general  EBL:  10 mL   BLOOD ADMINISTERED:none  DRAINS: none   LOCAL MEDICATIONS USED:  MARCAINE     SPECIMEN:  Source of Specimen:  gallbladder  DISPOSITION OF SPECIMEN:  PATHOLOGY  COUNTS:  YES  TOURNIQUET:  * No tourniquets in log *  DICTATION: .Dragon Dictation  PLAN OF CARE: Discharge to home after PACU  PATIENT DISPOSITION:  PACU - hemodynamically stable.   Delay start of Pharmacological VTE agent (>24hrs) due to surgical blood loss or risk of bleeding: not applicable  Mary Sella. Andrey Campanile, MD, FACS General, Bariatric, & Minimally Invasive Surgery [facility] [place]

## 2020-05-07 NOTE — Anesthesia Postprocedure Evaluation (Signed)
Anesthesia Post Note  Patient: Loretta [name]  Procedure(s) Performed: XI ROBOTIC ASSISTED LAPAROSCOPIC CHOLECYSTECTOMY (N/A Abdomen)     Patient location during evaluation: PACU Anesthesia Type: General Level of consciousness: awake and alert Pain management: pain level controlled Vital Signs Assessment: post-procedure vital signs reviewed and stable Respiratory status: spontaneous breathing, nonlabored ventilation, respiratory function stable and patient connected to nasal cannula oxygen Cardiovascular status: blood pressure returned to baseline and stable Postop Assessment: no apparent nausea or vomiting Anesthetic complications: no   No complications documented.  Last Vitals:  Vitals:   05/07/20 1230 05/07/20 1716  BP: (!) 150/91 135/88  Pulse: (!) 102 92  Resp: 16 17  Temp: 36.9 C 36.4 C  SpO2: 100% 100%    Last Pain:  Vitals:   05/07/20 1716  TempSrc:   PainSc: Asleep                 Trevor Iha

## 2020-05-07 NOTE — Anesthesia Procedure Notes (Signed)
Procedure Name: Intubation Performed by: Zaion Hreha J, CRNA Pre-anesthesia Checklist: Patient identified, Emergency Drugs available, Suction available, Patient being monitored and Timeout performed Patient Re-evaluated:Patient Re-evaluated prior to induction Oxygen Delivery Method: Circle system utilized Preoxygenation: Pre-oxygenation with 100% oxygen Induction Type: IV induction Ventilation: Mask ventilation without difficulty Laryngoscope Size: Mac and 3 Grade View: Grade I Tube type: Oral Tube size: 7.0 mm Number of attempts: 1 Airway Equipment and Method: Stylet Placement Confirmation: ETT inserted through vocal cords under direct vision,  positive ETCO2 and breath sounds checked- equal and bilateral Secured at: 21 cm Tube secured with: Tape Dental Injury: Teeth and Oropharynx as per pre-operative assessment        

## 2020-05-07 NOTE — Transfer of Care (Signed)
Immediate Anesthesia Transfer of Care Note  Patient: Loretta [name]  Procedure(s) Performed: XI ROBOTIC ASSISTED LAPAROSCOPIC CHOLECYSTECTOMY (N/A Abdomen)  Patient Location: PACU  Anesthesia Type:General  Level of Consciousness: sedated, patient cooperative and responds to stimulation  Airway & Oxygen Therapy: Patient Spontanous Breathing and Patient connected to face mask oxygen  Post-op Assessment: Report given to RN and Post -op Vital signs reviewed and stable  Post vital signs: Reviewed and stable  Last Vitals:  Vitals Value Taken Time  BP 135/88 05/07/20 1716  Temp    Pulse 88 05/07/20 1717  Resp 16 05/07/20 1717  SpO2 100 % 05/07/20 1717  Vitals shown include unvalidated device data.  Last Pain:  Vitals:   05/07/20 1233  TempSrc:   PainSc: 0-No pain      Patients Stated Pain Goal: 4 (05/07/20 1233)  Complications: No complications documented.

## 2020-05-11 LAB — SURGICAL PATHOLOGY

## 2020-07-07 ENCOUNTER — Other Ambulatory Visit: Payer: Self-pay

## 2020-07-07 ENCOUNTER — Ambulatory Visit (INDEPENDENT_AMBULATORY_CARE_PROVIDER_SITE_OTHER): Payer: Medicaid Other | Admitting: Family Medicine

## 2020-07-07 ENCOUNTER — Encounter: Payer: Self-pay | Admitting: Family Medicine

## 2020-07-07 VITALS — BP 120/60 | HR 104 | Ht 62.0 in | Wt 152.4 lb

## 2020-07-07 DIAGNOSIS — K625 Hemorrhage of anus and rectum: Secondary | ICD-10-CM

## 2020-07-07 DIAGNOSIS — Z23 Encounter for immunization: Secondary | ICD-10-CM

## 2020-07-07 DIAGNOSIS — K9049 Malabsorption due to intolerance, not elsewhere classified: Secondary | ICD-10-CM | POA: Diagnosis not present

## 2020-07-07 DIAGNOSIS — Z9049 Acquired absence of other specified parts of digestive tract: Secondary | ICD-10-CM | POA: Diagnosis not present

## 2020-07-07 DIAGNOSIS — K649 Unspecified hemorrhoids: Secondary | ICD-10-CM

## 2020-07-07 MED ORDER — FAMOTIDINE 20 MG PO TABS
20.0000 mg | ORAL_TABLET | Freq: Two times a day (BID) | ORAL | 3 refills | Status: DC
Start: 2020-07-07 — End: 2023-01-17

## 2020-07-07 NOTE — Progress Notes (Signed)
    SUBJECTIVE:   CHIEF COMPLAINT / HPI:   Rectal pain: pt states she has been having episodes of rectal pain with bowel movements.  She noticed a 'bump' on her anus in the same location as the pain.  She will sometimes notice a few drops of bright red blood on the toilet paper when she has a bowel movement. She used OTC hemorrhoid creams and suppositories.  The pain will usually last about 5 days.  She does not currently have any rectal pain.    Abdominal pain/bloating: pt had her gallbladder removed in July.  Since that time she has episodes of urgency with bowel movements.  No incontinence.  She also feels a 'bloating' sensation in her belly.  She has no nausea or vomiting. Her bowel movements are more watery, yellow in color, and floating since the surgery.  She also feels like her heartburn has become worse lately.  She is still taking her prevacid.   PERTINENT  PMH / PSH: cholecystectomy  OBJECTIVE:   BP 120/60   Pulse (!) 104   Ht 5\' 2"  (1.575 m)   Wt 152 lb 6 oz (69.1 kg)   SpO2 97%   BMI 27.87 kg/m   Gen: alert, oriented. No acute distress. Accompanied by two daughters.   CV: RRR. No murmurs Pulm: LCTAB. No wheezes.   GI: soft, nontender to palpation.  Normal bowel sounds.  Pt declined rectal exam.    ASSESSMENT/PLAN:   Hemorrhoids Likely originated during pregnancy and exacerbated by changes in bowel movement consistency/frequency s/p cholecystectomy.  Declined exam.  Advised pt to eat low fat diet, use fiber supplementation and to treat with OTC hemorrhoid treatments when episodes occur.    S/P cholecystectomy     Fat malabsorption Pt had cholecystectomy 2 months ago and is now complaining of yellowish diarrhea and abdominal bloating/discomfort. 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  Discussed with pt eating a low fat diet and increasing fiber intake.  Symptoms should improve over time but if persistent may need referral to GI for  further evaluation.      , MD [facility]

## 2020-07-07 NOTE — Patient Instructions (Addendum)
I'd like you to stop taking the Prevacid.  Instead I want you to take the pepcid two times per day.  If this does not help you can also start taking over the counter TUMS. I have included some information about a low fat diet below.  When you have pain in your rectum you should use the over the counter preparation H.     Me gustara que dejara de tomar Prevacid. En su lugar, quiero que tome el pepcid [employer] al da. Si esto no ayuda, tambin puede comenzar a tomar TUMS de [address]. He incluido informacin sobre una dieta baja en grasas a continuacin. Cuando tenga dolor en el recto, debe usar la preparation H.  Plan de alimentacin restringido en grasas y colesterol Fat and Cholesterol Restricted Eating Plan El exceso de grasas y colesterol en la dieta puede causar problemas de [place]. Elegir los alimentos adecuados ayuda a [employer] niveles de grasas y [place]. Esto puede evitarle contraer ciertas enfermedades. El mdico puede recomendarle un plan de alimentacin que incluya lo siguiente:  Grasas totales: ______% o menos del total de caloras por da.  Grasas saturadas: ______% o menos del total de caloras por da.  Colesterol: menos de _________mg Karie Chimera.  Fibra: ______g Karie Chimera. Cules son algunos consejos para seguir este plan? Planificacin de las comidas  En las comidas, [place] su plato en cuatro partes iguales: ? Llene la mitad del plato con verduras y ensaladas de hojas verdes. ? Llene un cuarto del plato con cereales integrales. ? Llene un cuarto del plato con alimentos con protenas con bajo contenido de grasas [employer]).  Coma pescado con alto contenido de grasas omega3 al [employer] veces por semana. Esas grasas se encuentran en la caballa, el atn, las sardinas y el salmn.  Coma alimentos con [address] contenido de [place], como cereales [place], frijoles, [place], [place], [place], guisantes y [place]. Consejos generales   Si necesita adelgazar,  consulte a su mdico.  Evite lo siguiente: ? Alimentos con Engineer, mining. ? Comidas fritas. ? Alimentos con aceites parcialmente hidrogenados.  Limite el consumo de alcohol a no ms de por da si es mujer y no est [place], y por da si es hombre. Una medida equivale a 12oz de Financial controller, 5oz de vino o 1oz de bebidas alcohlicas de alta graduacin. Lea las etiquetas de los alimentos  Lea las etiquetas de los alimentos para conocer lo siguiente: ? Si contienen grasas trans. ? Si contienen aceites parcialmente hidrogenados. ? La cantidad de grasas saturadas (g) que contiene cada porcin. ? La cantidad de colesterol (mg) que contiene cada porcin. ? La cantidad de fibra (g) que contiene cada porcin.  Elija alimentos con grasas saludables, tales como las siguientes: ? Grasas monoinsaturadas. ? Grasas poliinsaturadas. ? Grasas omega3.  Elija productos de cereal que tengan cereales integrales. Busque la palabra "integral" en Estate agent de la lista de ingredientes. Al cocinar  Emplee mtodos de coccin con poca cantidad de grasa. Por ejemplo, hornear, hervir, grillar y Transport planner.  Coma ms comidas caseras. Coma en restaurantes y bares con menos frecuencia.  Evite cocinar usando grasas saturadas, como [place], crema, aceite de [place], aceite de palmiste y aceite de [place]. Plush recomendados  Frutas  Frutas frescas, en conserva (en su jugo natural) o frutas congeladas. Verduras  Verduras frescas o congeladas (crudas, al vapor, asadas o grilladas). Ensaladas de hojas verdes. Granos  Cereales integrales, como panes, galletas, cereales y pastas de [address] o de [place]  integral. Avena sin endulzar, trigo bulgur, cebada, quinua o arroz integral. Tortillas de harina de maz o trigo integral. Carnes y otros alimentos ricos en protenas  Carne de res molida (al 85% o ms magra), carne de res de animales alimentados con pastos o carne de res sin la grasa. Pollo  o pavo sin piel. Carne de pollo o de [place]. Cerdo sin la grasa. Todos los pescados y frutos de mar. Claras de huevo. Porotos, guisantes o lentejas secos. Frutos secos o semillas sin sal. Frijoles enlatados sin sal. Mantequillas de frutos secos sin azcar ni aceite agregados. Lcteos  Productos lcteos descremados o semidescremados, como [employer] o al 1%, quesos reducidos en grasas o al 2%, queso cottage o ricota con bajo contenido de [place] o sin contenido de [place], o yogur natural descremado o semidescremado. Grasas y aceites  Margarina untable que no contenga grasas trans. [place] y condimentos para ensaladas livianos o reducidos en grasas. [place]. Aceites de oliva, can[place], ssamo o crtamo. Es posible que los productos que se enumeran ms [place] no constituyan una lista completa de los alimentos y las bebidas que puede tomar. Consulte a un nutricionista para obtener ms informacin. Alimentos que deben evitarse Frutas  Fruta enlatada en almbar espeso. Frutas con salsa de crema o mantequilla. Frutas cocidas en aceite. Verduras  Verduras cocinadas con salsas de queso, crema o mantequilla. Verduras fritas. Granos  Pan blanco. Pastas blancas. Arroz blanco. Pan de maz. Bagels, pasteles y croissants. Galletas saladas y colaciones que contengan grasas trans y aceites hidrogenados. Carnes y otros alimentos ricos en protenas  Cortes de carne con alto contenido de [place]. Costillas, alas de pollo, tocineta, salchicha, mortadela, salame, chinchulines, tocino, perros calientes, salchichas alemanas y embutidos envasados. Hgado y otros rganos. Huevos enteros y yemas de [place]. Pollo y pavo con piel. Carne frita. Lcteos  [place] entera o al 2%, crema, mezcla de [place] y crema, y queso crema. Quesos enteros. Yogur entero o endulzado. Quesos con toda su grasa. [place] no lcteas y coberturas batidas. Quesos procesados, quesos para untar y [place]. Bebidas  Alcohol. Bebidas  endulzadas con azcar, como refrescos, limonada y bebidas frutales. Grasas y [address], [place] en barra, [place] de [place], [place], [place] clarificada o grasa de tocino. Aceites de coco, de palmiste y de palma. Dulces y postres  Jarabe de maz, azcares, miel y Radio broadcast assistant. Caramelos. Mermeladas y [place]. Doreen Beam. Cereales endulzados. Galletas, pasteles, bizcochuelos, donas, muffins y helado. Es posible que los productos que se enumeran ms [place] no constituyan una lista completa de los alimentos y las bebidas que Personnel officer. Consulte a un nutricionista para obtener ms informacin. Resumen  Elegir los alimentos adecuados ayuda a Pharmacologist normales los niveles de grasas y [place]. Esto puede evitarle contraer ciertas enfermedades.  En las comidas, llene la mitad del plato con verduras y ensaladas de hojas verdes.  Coma alimentos con alto contenido de [place], como cereales [place], frijoles, [place], zanahorias, guisantes y [place].  Limite los alimentos con azcar agregada y grasas saturadas, el alcohol y las comidas fritas. Esta informacin no tiene Theme park manager el consejo del mdico. Asegrese de hacerle al mdico cualquier pregunta que tenga. Document Revised: 06/12/2018 Document Reviewed: 09/26/2017 Elsevier Patient Education  2020 [employer].

## 2020-07-08 LAB — COMPREHENSIVE METABOLIC PANEL
ALT: 41 IU/L — ABNORMAL HIGH (ref 0–32)
AST: 29 IU/L (ref 0–40)
Albumin/Globulin Ratio: 1.4 (ref 1.2–2.2)
Albumin: 4.4 g/dL (ref 3.8–4.8)
Alkaline Phosphatase: 106 IU/L (ref 44–121)
BUN/Creatinine Ratio: 16 (ref 9–23)
BUN: 11 mg/dL (ref 6–20)
Bilirubin Total: <0.2 mg/dL (ref 0.0–1.2)
CO2: 17 mmol/L — ABNORMAL LOW (ref 20–29)
Calcium: 9.4 mg/dL (ref 8.7–10.2)
Chloride: 104 mmol/L (ref 96–106)
Creatinine, Ser: 0.7 mg/dL (ref 0.57–1.00)
GFR calc Af Amer: 132 mL/min/{1.73_m2} (ref >59)
GFR calc non Af Amer: 114 mL/min/{1.73_m2} (ref >59)
Globulin, Total: 3.2 g/dL (ref 1.5–4.5)
Glucose: 95 mg/dL (ref 65–99)
Potassium: 3.9 mmol/L (ref 3.5–5.2)
Sodium: 139 mmol/L (ref 134–144)
Total Protein: 7.6 g/dL (ref 6.0–8.5)

## 2020-07-09 DIAGNOSIS — K649 Unspecified hemorrhoids: Secondary | ICD-10-CM | POA: Insufficient documentation

## 2020-07-09 DIAGNOSIS — K9049 Malabsorption due to intolerance, not elsewhere classified: Secondary | ICD-10-CM | POA: Insufficient documentation

## 2020-07-09 NOTE — Assessment & Plan Note (Signed)
Pt had cholecystectomy 2 months ago and is now complaining of yellowish diarrhea and abdominal bloating/discomfort. 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  Discussed with pt eating a low fat diet and increasing fiber intake.  Symptoms should improve over time but if persistent may need referral to GI for further evaluation.

## 2020-07-09 NOTE — Assessment & Plan Note (Signed)
Likely originated during pregnancy and exacerbated by changes in bowel movement consistency/frequency s/p cholecystectomy.  Declined exam.  Advised pt to eat low fat diet, use fiber supplementation and to treat with OTC hemorrhoid treatments when episodes occur.

## 2020-11-16 ENCOUNTER — Emergency Department (HOSPITAL_COMMUNITY)
Admission: EM | Admit: 2020-11-16 | Discharge: 2020-11-17 | Discharge status: Against Medical Advice | Payer: Medicaid Other

## 2020-11-16 NOTE — ED Notes (Signed)
Pt called multiple times for triage, no answer. 

## 2020-11-19 ENCOUNTER — Other Ambulatory Visit: Payer: Self-pay | Admitting: Family Medicine

## 2020-11-19 ENCOUNTER — Ambulatory Visit (INDEPENDENT_AMBULATORY_CARE_PROVIDER_SITE_OTHER): Payer: Medicaid Other | Admitting: Family Medicine

## 2020-11-19 ENCOUNTER — Other Ambulatory Visit: Payer: Self-pay

## 2020-11-19 ENCOUNTER — Telehealth: Payer: Self-pay | Admitting: Family Medicine

## 2020-11-19 VITALS — BP 110/70 | HR 99 | Ht 60.0 in | Wt 153.0 lb

## 2020-11-19 DIAGNOSIS — M549 Dorsalgia, unspecified: Secondary | ICD-10-CM | POA: Diagnosis not present

## 2020-11-19 DIAGNOSIS — M545 Low back pain, unspecified: Secondary | ICD-10-CM

## 2020-11-19 DIAGNOSIS — R300 Dysuria: Secondary | ICD-10-CM

## 2020-11-19 LAB — POCT URINALYSIS DIP (MANUAL ENTRY)
Bilirubin, UA: NEGATIVE
Glucose, UA: NEGATIVE mg/dL
Ketones, POC UA: NEGATIVE mg/dL
Nitrite, UA: NEGATIVE
Protein Ur, POC: NEGATIVE mg/dL
Spec Grav, UA: 1.01 (ref 1.010–1.025)
Urobilinogen, UA: 0.2 E.U./dL
pH, UA: 7 (ref 5.0–8.0)

## 2020-11-19 LAB — POCT UA - MICROSCOPIC ONLY

## 2020-11-19 MED ORDER — KETOROLAC TROMETHAMINE 30 MG/ML IJ SOLN
15.0000 mg | Freq: Once | INTRAMUSCULAR | Status: AC
  Administered 2020-11-19: 15 mg via INTRAMUSCULAR

## 2020-11-19 MED ORDER — CEPHALEXIN 500 MG PO CAPS
500.0000 mg | ORAL_CAPSULE | Freq: Two times a day (BID) | ORAL | 0 refills | Status: AC
Start: 2020-11-19 — End: 2020-11-26

## 2020-11-19 NOTE — Assessment & Plan Note (Addendum)
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 She may have pulled a muscle. No red flags for back pain which is reassuring. No spinal imaging on file to date so will order x-rays. Patient received 15mg  Toradol injection in clinic today. -Tylenol, motrin -Heat pad -Lumbar and sacral spine xray  -Follow up with PCP for xray results

## 2020-11-19 NOTE — Patient Instructions (Addendum)
Thank you for coming to see me today. It was a pleasure. Today we discussed your back pain. I think it is related to your job and it is a muscle spasms. Take tylenol and motrin and do gentle activity.  You may have a urine infection. I will call if the urine test is abnormal.  I have placed an order for spine xray.  Please go to [facility] at [employer] or at [facility] to have this completed.  You do not need an appointment, but if you would like to call them beforehand, their number is [phone].  We will contact you with your results afterwards.  Address: [address] [place], [place], [place] [address]  Please follow-up with your PCP in 2 weeks for the results.   If you have any questions or concerns, please do not hesitate to call the office at [phone].   Best wishes,   Dr Allena Katz

## 2020-11-19 NOTE — Telephone Encounter (Signed)
Called pt using spanish interpretor to inform her that her I am sending keflex 500mg  BID for days to the pharmacy.

## 2020-11-19 NOTE — Assessment & Plan Note (Addendum)
Obtained UA today for possible UTI. Low suspicion for pyelonephritis as no fevers or renal angle tenderness. Will inform pt of results if positive.

## 2020-11-19 NOTE — Progress Notes (Signed)
     SUBJECTIVE:   CHIEF COMPLAINT / HPI:   Loretta [name] is a 35 y.o. female presents with back pain  Back Pain Onset: 2 weeks ago, suddenly, was at work at the time  Location/Radiation: lumbar pain, radiates towards the top sometimes   Duration: intermittent Character: cannot describe Aggravating Factors: leaning over, standing  Alleviating Factors: tylenol, heat pads Severity: 7/10  Denies history of trauma, history of prolonged steroid use, bowel or bladder incontinence, urinary retention, numbness or tingling in extremities or saddle anesthesia, weakness in extremities, fevers, chills, IV drug use, hemodialysis, hx cancer, changes in weight, night sweats. No history of osteoporosis.   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   Urinary sx 3 day hx of urinary frequency and dysuria. Denies chance of pregnancy, fevers, lower abdominal pain, hematuria. Pt is not on birth control. LMP-unsure as they are irregular. Last sexually active 2 months ago.   PERTINENT  PMH / PSH: GERD, back pain, hemorrhoids   OBJECTIVE:   BP 110/70   Pulse 99   Ht 5' (1.524 m)   Wt 153 lb (69.4 kg)   LMP 10/05/2020   SpO2 99%   BMI 29.88 kg/m    General: Alert, no acute distress, pleasant, cooperative Cardio: Normal S1 and S2, RRR, no r/m/g Pulm: CTAB, normal work of breathing Abdomen: Bowel sounds normal. Abdomen soft and non-tender.  No renal angle tenderness. Extremities: No peripheral edema.  Neuro: Cranial nerves grossly intact   Back Normal skin, Spine with normal alignment and no deformity.  Tenderness on palpation of lumbar vertebral process. Tenderness on palpation of left and right paraspinous muscles.   Range of motion is full at neck and lumbar sacral regions. Negative straight leg test. Strength 5/5 LE. Normal sensation throughout LE. 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 She may have pulled a muscle. No red flags for back pain which is reassuring. No spinal imaging on file to date so will order x-rays. Patient received 15mg  Toradol injection in clinic today. -Tylenol, motrin -Heat pad -Lumbar and sacral spine xray  -Follow up with PCP for xray results   Dysuria Obtained UA today for possible UTI. Low suspicion for pyelonephritis as no fevers or renal angle tenderness. Will inform pt of results if positive.     , MD PGY-2 [facility]

## 2020-12-15 ENCOUNTER — Other Ambulatory Visit: Payer: Self-pay | Admitting: Family Medicine

## 2021-06-06 IMAGING — US US PELVIS COMPLETE WITH TRANSVAGINAL
1 series · 15 of 25 positions shown · non-contrast
Comparison: None

CLINICAL DATA: Pelvic pain, dysmenorrhea



[Series 1: us pelvis complete with transvaginal · 15 of 41 slices shown]
[im 1/41]
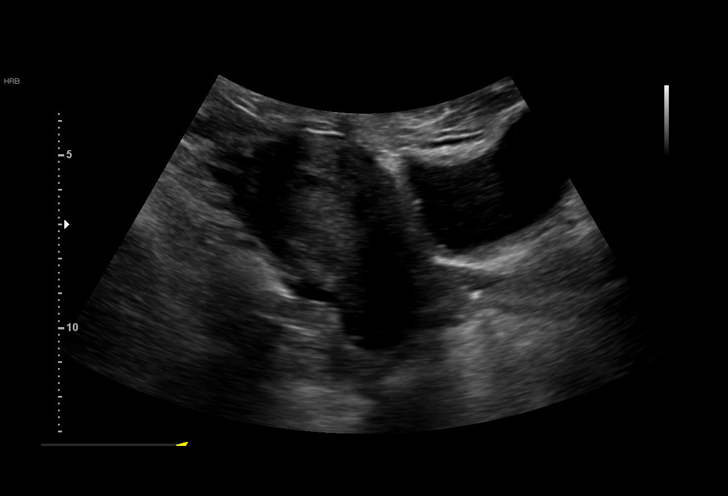
[im 4/41]
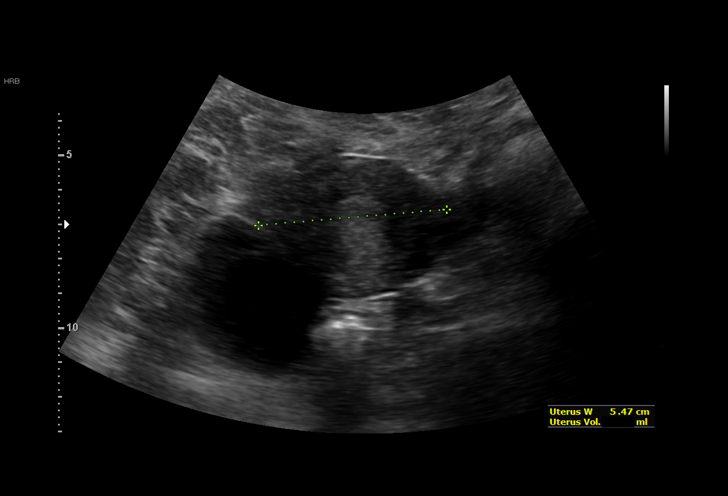
[im 7/41]
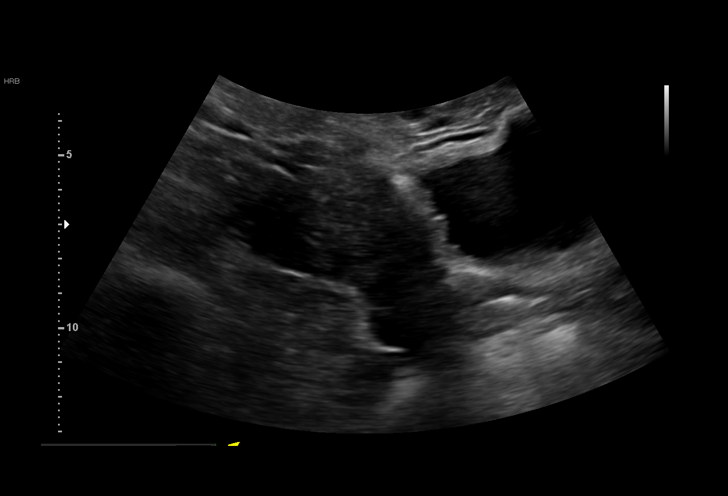
[im 9/41]
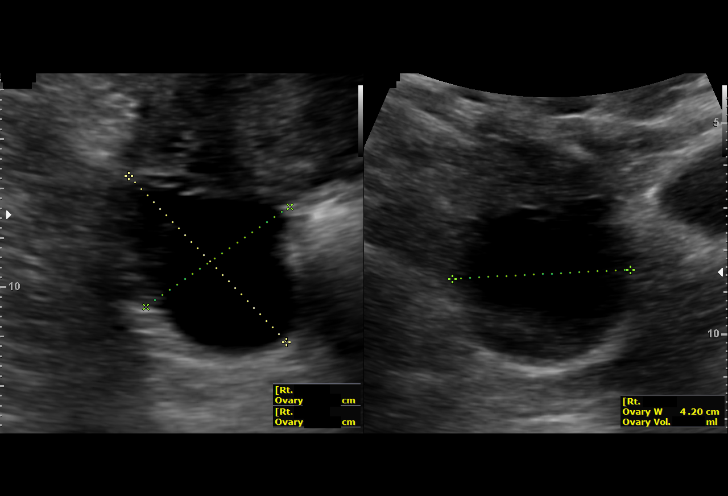
[im 12/41]
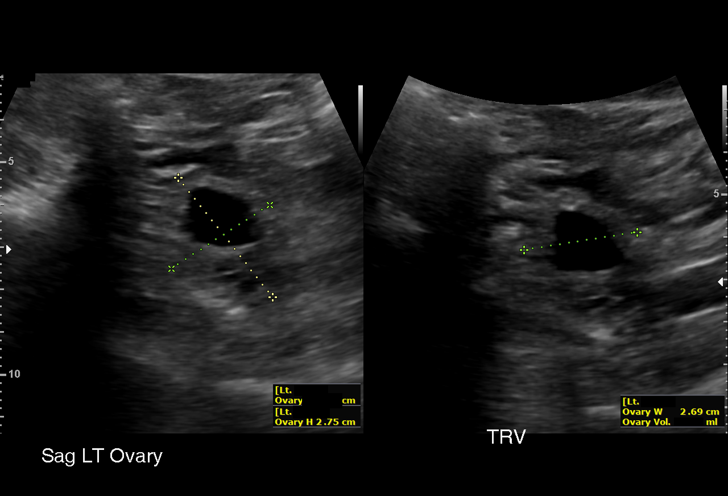
[im 16/41]
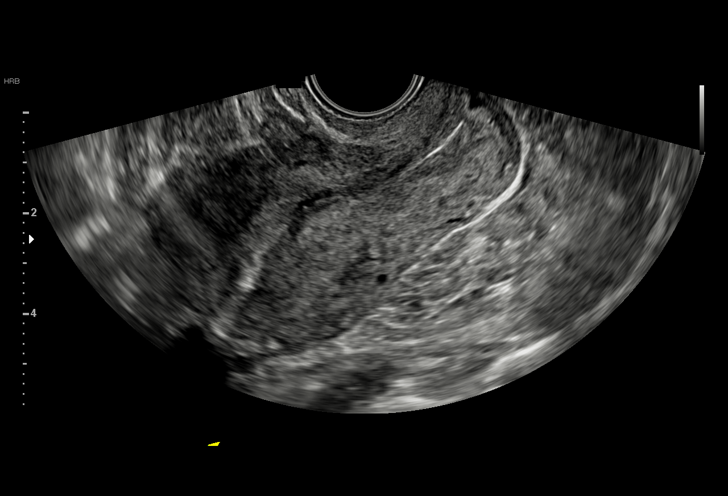
[im 17/41]
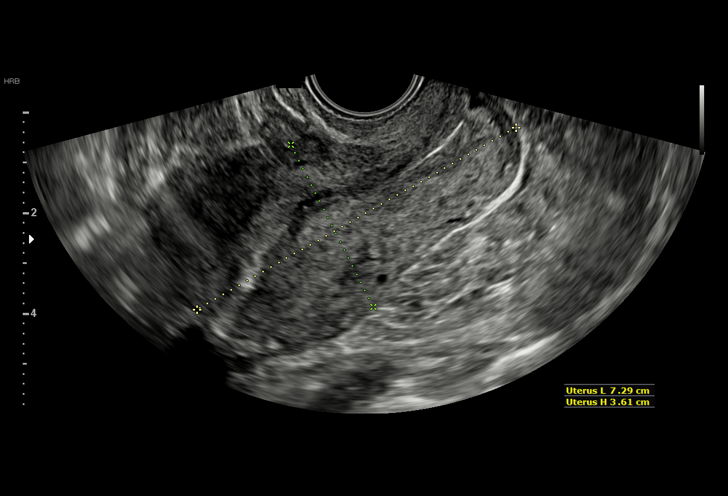
[im 21/41]
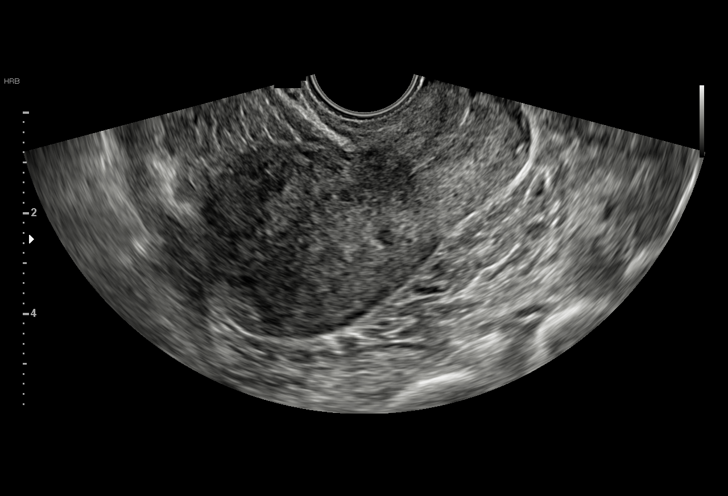
[im 24/41]
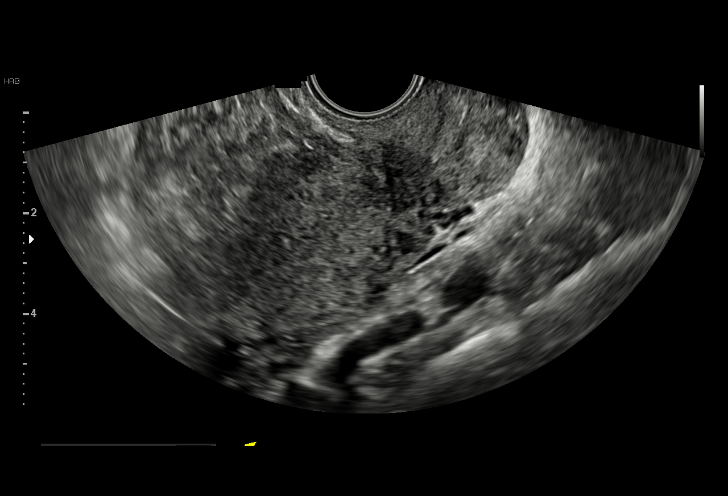
[im 26/41]
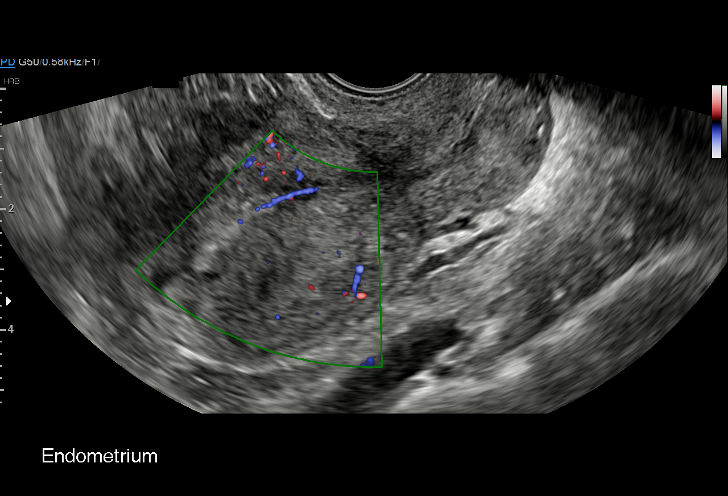
[im 29/41]
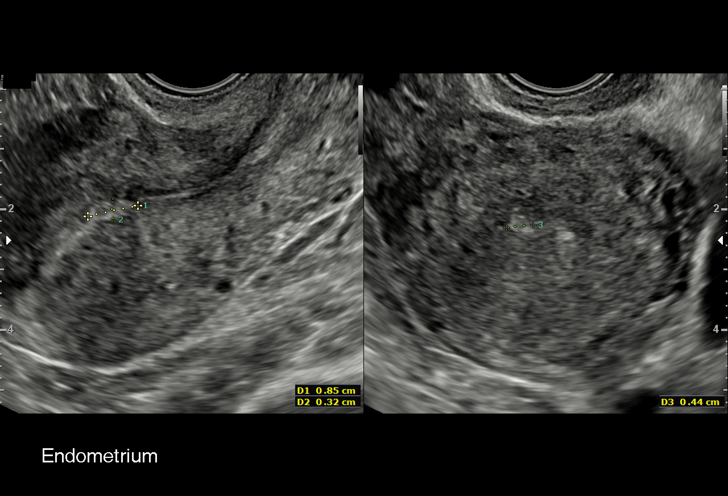
[im 32/41]
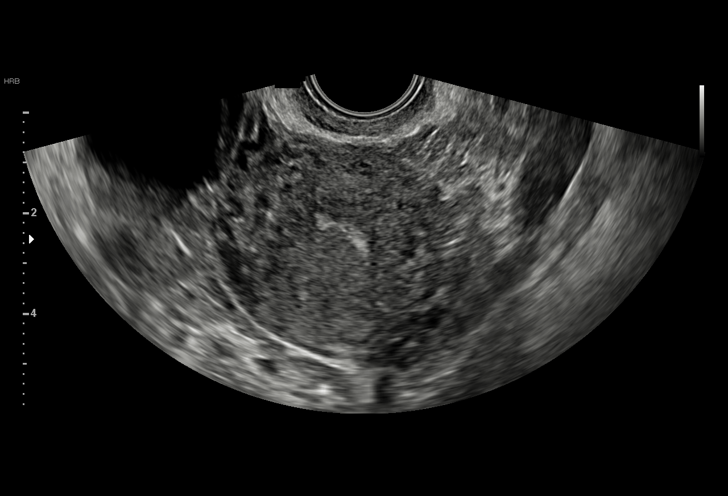
[im 34/41]
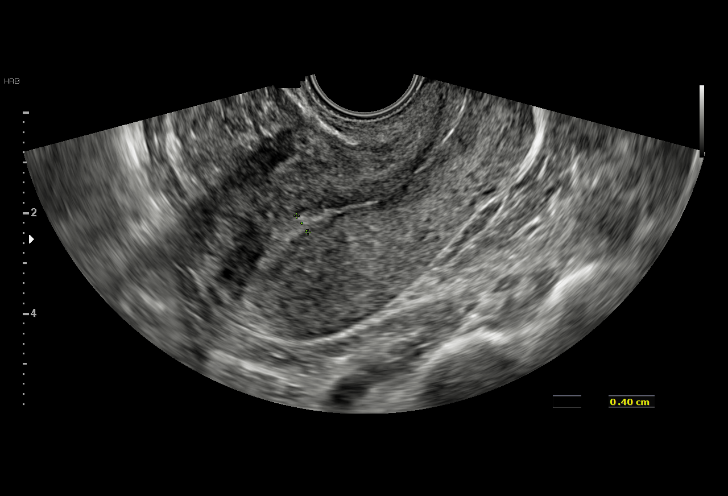
[im 37/41]
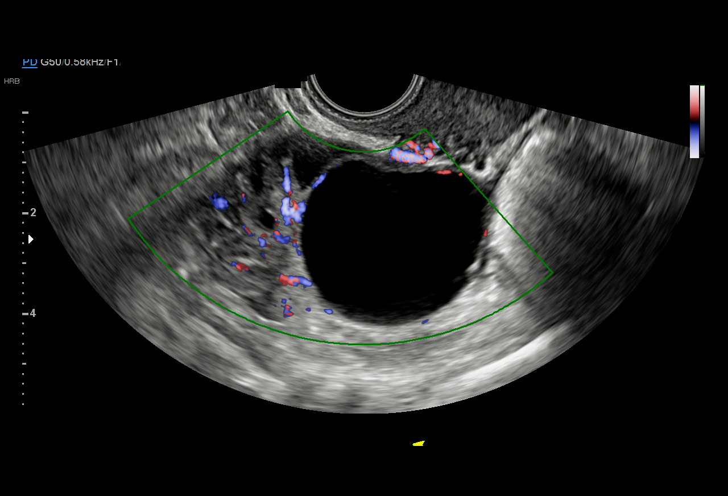
[im 41/41]
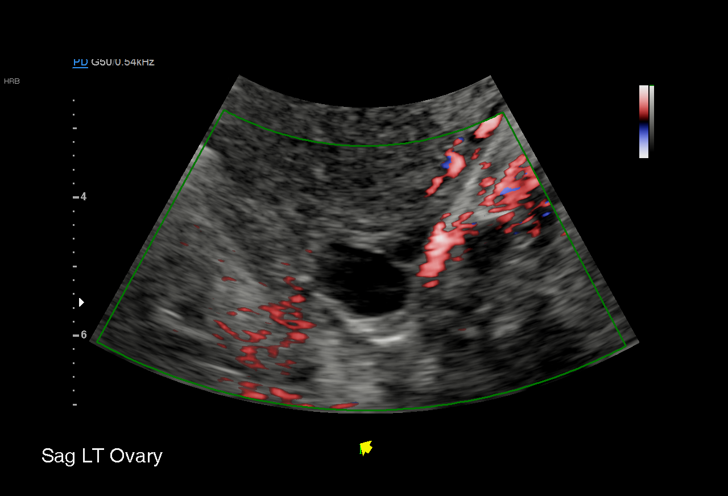

[15 of 25 positions shown; findings below may reference images not displayed]

FINDINGS: Uterus

Measurements: 7.1 x 4.0 x 5.1 cm = volume: 75 mL. No fibroids or
other mass visualized.

Endometrium

Thickness: 4 mm in thickness. Focal echogenic mass within the
endometrium with vascular stalk. This measures 9 x 4 x 3 mm.

Right ovary

Measurements: 5.0 x 4.0 x 3.8 cm = volume: 36 mL. Simple appearing
3.6 cm cyst.

Left ovary

Measurements: 2.6 x 1.3 x 1.8 cm = volume: 7.5 mL. Normal
appearance/no adnexal mass.

Other findings

No abnormal free fluid.
IMPRESSION: 9 mm echogenic mass with vascular stalk noted in the endometrium. A
focal endometrial lesion is suspected. Consider sonohysterogram for
further evaluation, prior to hysteroscopy or endometrial biopsy.

3.6 cm simple appearing right ovarian cyst.

## 2021-11-04 IMAGING — US US ABDOMEN LIMITED
1 series · 14 of 25 positions shown · non-contrast
Comparison: None.

CLINICAL DATA: Right upper quadrant pain

EXAM:
ULTRASOUND ABDOMEN LIMITED RIGHT UPPER QUADRANT

[Series 1: us abdomen limited · 14 of 44 slices shown]
[im 1/44]
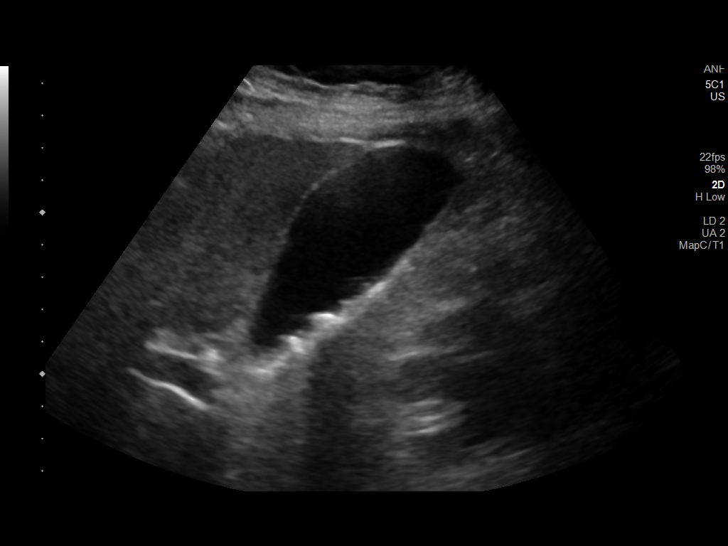
[im 4/44]
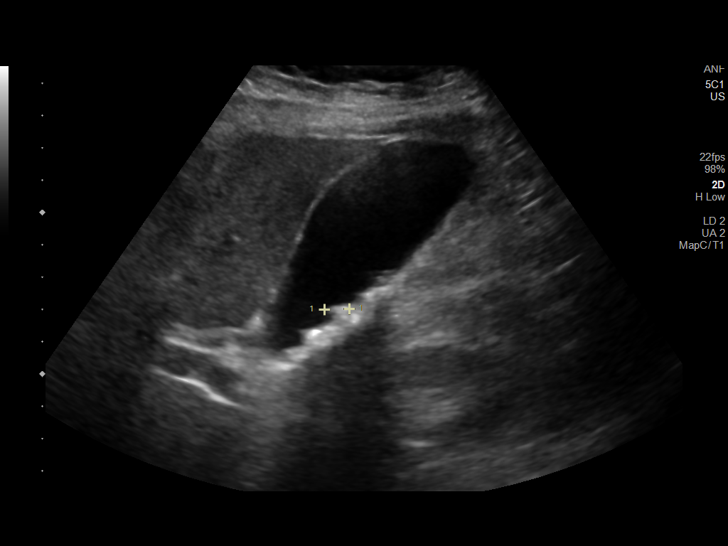
[im 8/44]
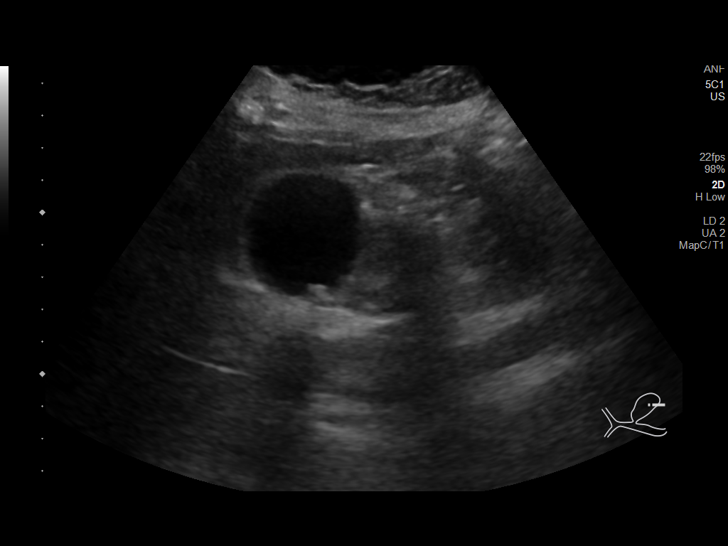
[im 11/44]
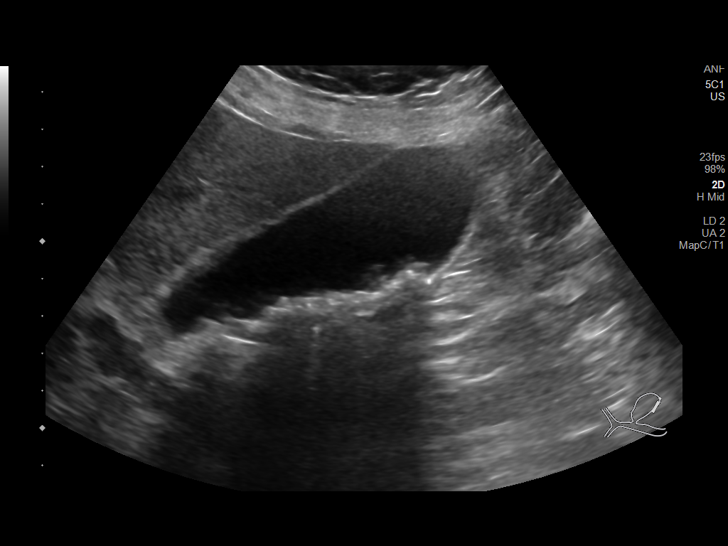
[im 15/44]
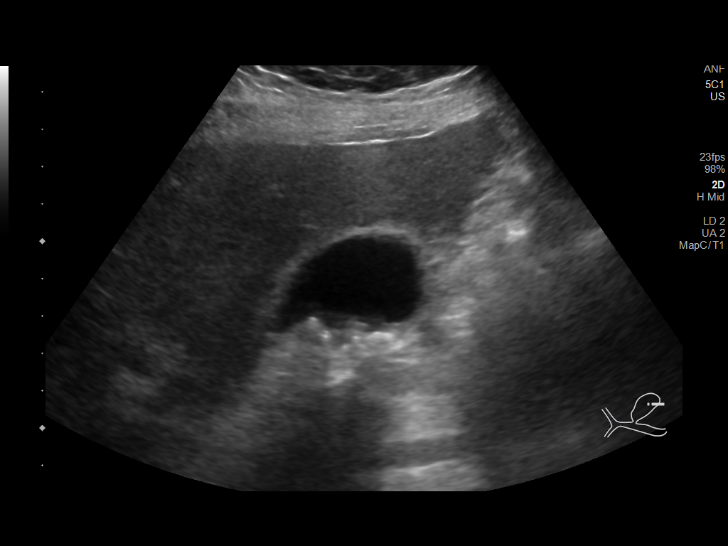
[im 17/44]
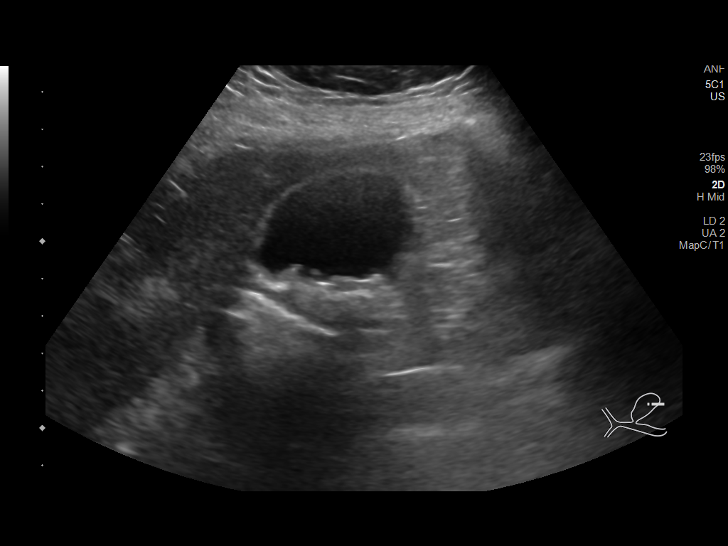
[im 20/44]
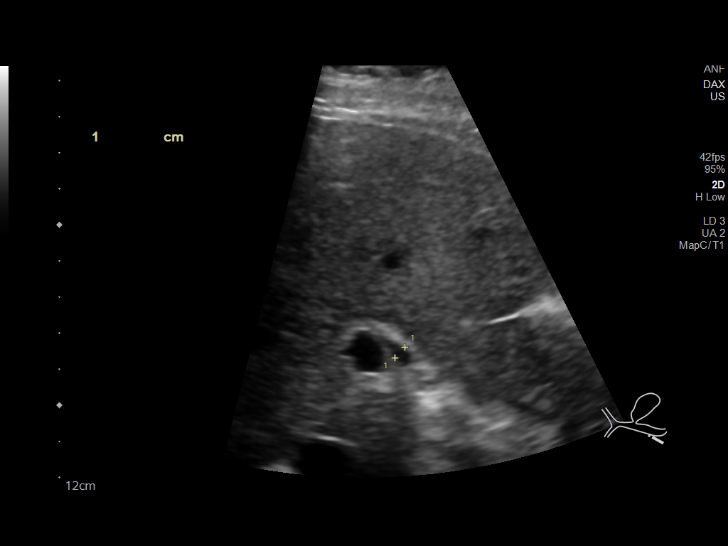
[im 24/44]
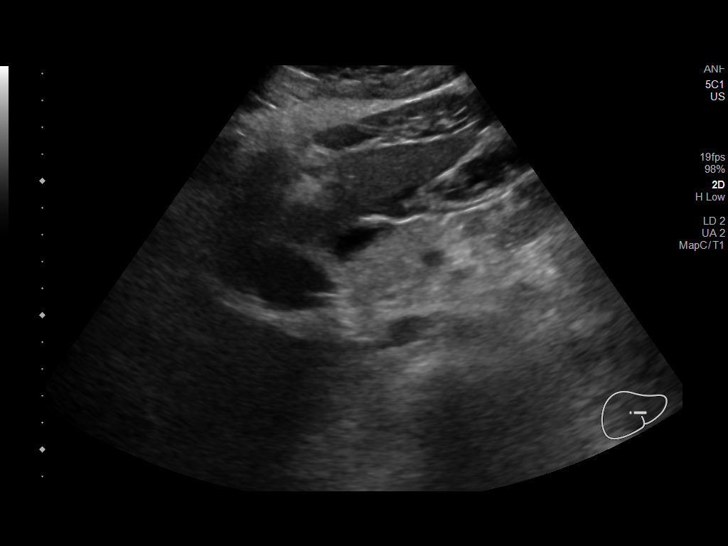
[im 27/44]
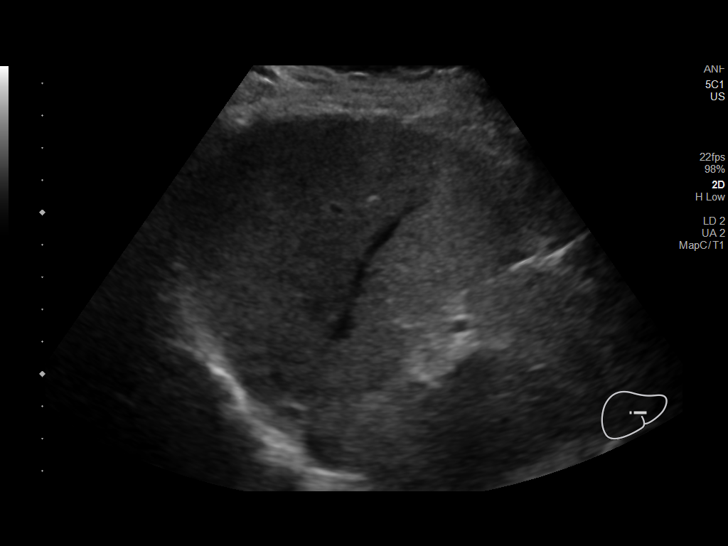
[im 29/44]
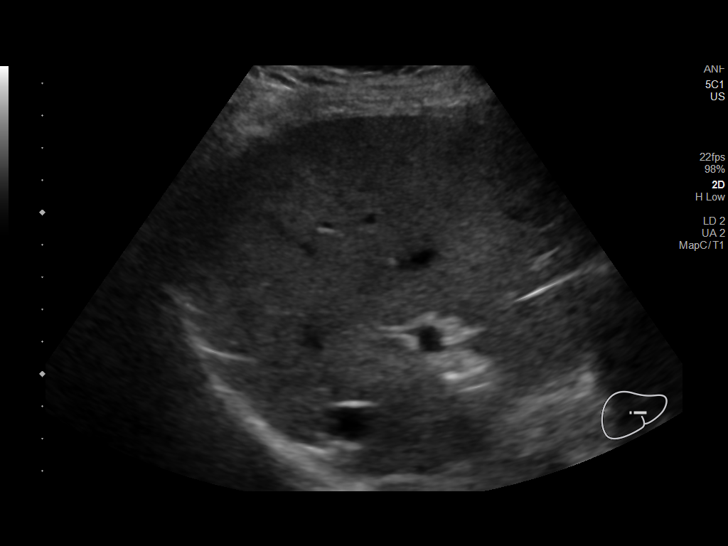
[im 33/44]
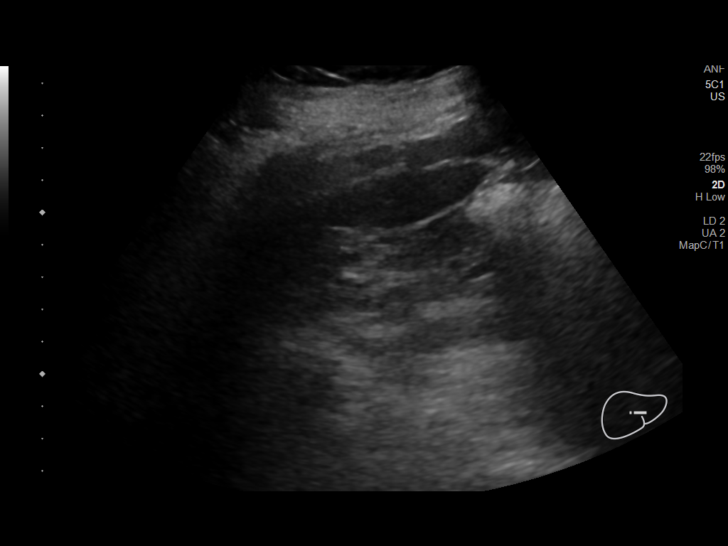
[im 36/44]
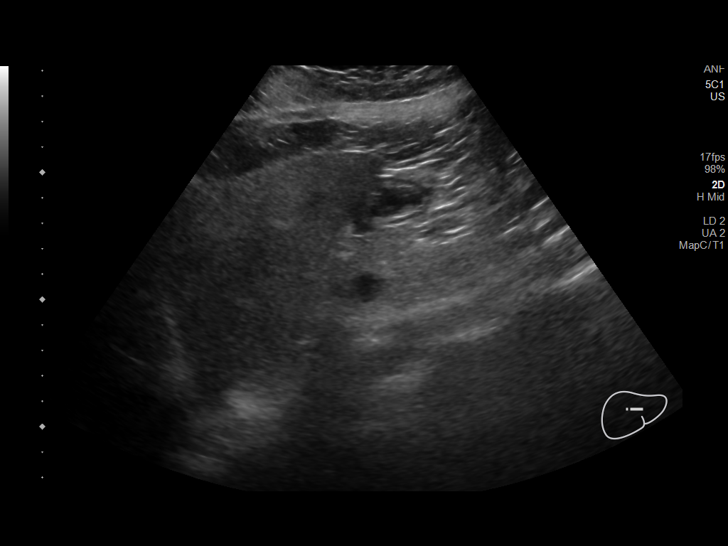
[im 40/44]
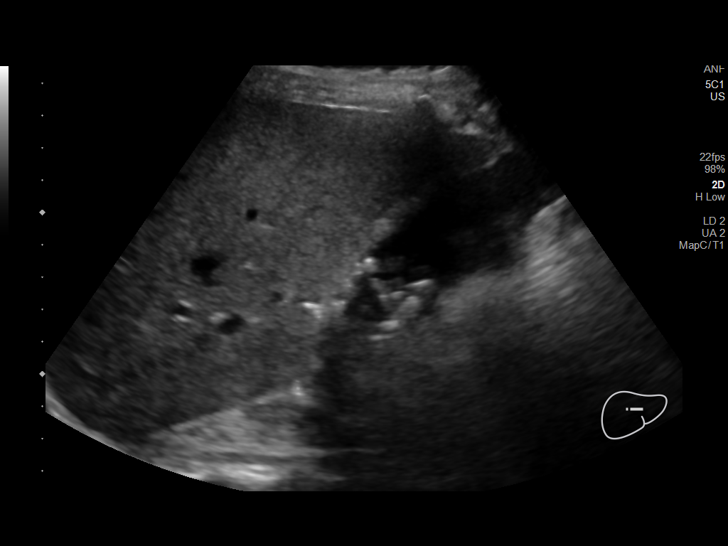
[im 44/44]
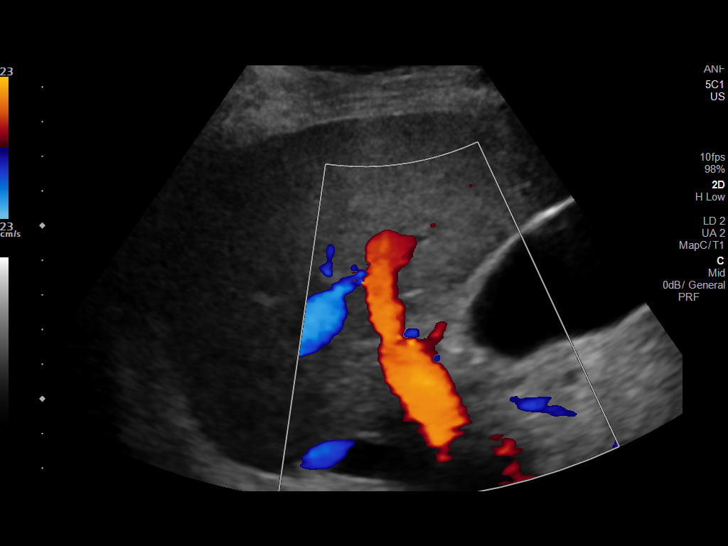

[14 of 25 positions shown; findings below may reference images not displayed]

FINDINGS: Gallbladder:

Gallstones measuring up to 8 mm. No wall thickening visualized. No
sonographic Murphy sign noted by sonographer.

Common bile duct:

Diameter: 4 mm, normal

Liver:

No focal lesion identified. Within normal limits in parenchymal
echogenicity. Portal vein is patent on color Doppler imaging with
normal direction of blood flow towards the liver.

Other: None.
IMPRESSION: Cholelithiasis without sonographic evidence of acute cholecystitis.

## 2022-07-15 ENCOUNTER — Other Ambulatory Visit: Payer: Self-pay

## 2022-07-15 ENCOUNTER — Emergency Department (HOSPITAL_COMMUNITY)
Admission: EM | Admit: 2022-07-15 | Discharge: 2022-07-15 | Disposition: A | Discharge status: Home / Self Care | Payer: Medicaid Other | Attending: Emergency Medicine | Admitting: Emergency Medicine

## 2022-07-15 DIAGNOSIS — M545 Low back pain, unspecified: Secondary | ICD-10-CM

## 2022-07-15 DIAGNOSIS — N12 Tubulo-interstitial nephritis, not specified as acute or chronic: Secondary | ICD-10-CM

## 2022-07-15 LAB — BASIC METABOLIC PANEL
Anion gap: 11 (ref 5–15)
BUN: 8 mg/dL (ref 6–20)
CO2: 18 mmol/L — ABNORMAL LOW (ref 22–32)
Calcium: 9.3 mg/dL (ref 8.9–10.3)
Chloride: 105 mmol/L (ref 98–111)
Creatinine, Ser: 0.71 mg/dL (ref 0.44–1.00)
GFR, Estimated: >60 mL/min (ref >60)
Glucose, Bld: 103 mg/dL — ABNORMAL HIGH (ref 70–99)
Potassium: 3.4 mmol/L — ABNORMAL LOW (ref 3.5–5.1)
Sodium: 134 mmol/L — ABNORMAL LOW (ref 135–145)

## 2022-07-15 LAB — CBC WITH DIFFERENTIAL/PLATELET
Abs Immature Granulocytes: 0.02 10*3/uL (ref 0.00–0.07)
Basophils Absolute: 0 10*3/uL (ref 0.0–0.1)
Basophils Relative: 0 %
Eosinophils Absolute: 0 10*3/uL (ref 0.0–0.5)
Eosinophils Relative: 0 %
HCT: 45.3 % (ref 36.0–46.0)
Hemoglobin: 15.3 g/dL — ABNORMAL HIGH (ref 12.0–15.0)
Immature Granulocytes: 0 %
Lymphocytes Relative: 27 %
Lymphs Abs: 2 10*3/uL (ref 0.7–4.0)
MCH: 29.3 pg (ref 26.0–34.0)
MCHC: 33.8 g/dL (ref 30.0–36.0)
MCV: 86.8 fL (ref 80.0–100.0)
Monocytes Absolute: 0.4 10*3/uL (ref 0.1–1.0)
Monocytes Relative: 5 %
Neutro Abs: 4.9 10*3/uL (ref 1.7–7.7)
Neutrophils Relative %: 68 %
Platelets: 434 10*3/uL — ABNORMAL HIGH (ref 150–400)
RBC: 5.22 MIL/uL — ABNORMAL HIGH (ref 3.87–5.11)
RDW: 12.9 % (ref 11.5–15.5)
WBC: 7.4 10*3/uL (ref 4.0–10.5)
nRBC: 0 % (ref 0.0–0.2)

## 2022-07-15 LAB — URINALYSIS, ROUTINE W REFLEX MICROSCOPIC
Bilirubin Urine: NEGATIVE
Glucose, UA: NEGATIVE mg/dL
Ketones, ur: NEGATIVE mg/dL
Nitrite: NEGATIVE
Protein, ur: NEGATIVE mg/dL
Specific Gravity, Urine: 1.005 (ref 1.005–1.030)
pH: 7 (ref 5.0–8.0)

## 2022-07-15 LAB — POC URINE PREG, ED: Preg Test, Ur: NEGATIVE

## 2022-07-15 MED ORDER — CIPROFLOXACIN HCL 500 MG PO TABS
500.0000 mg | ORAL_TABLET | Freq: Once | ORAL | Status: AC
  Administered 2022-07-15: 500 mg via ORAL
  Filled 2022-07-15: qty 1

## 2022-07-15 MED ORDER — CIPROFLOXACIN HCL 500 MG PO TABS: 500.0000 mg | ORAL_TABLET | Freq: Two times a day (BID) | ORAL | 0 refills | Status: DC

## 2022-07-15 MED ORDER — IBUPROFEN 400 MG PO TABS
600.0000 mg | ORAL_TABLET | Freq: Once | ORAL | Status: AC
  Administered 2022-07-15: 600 mg via ORAL
  Filled 2022-07-15: qty 1

## 2022-07-15 NOTE — Discharge Instructions (Addendum)
Diagnosed with pyelonephritis which is kidney infection related to UTI.  Treating with a 7-day course of ciprofloxacin as an antibiotic.  If you have new fever or worsening back or abdominal pain, worsening bloody urine please return to the emergency department for further evaluation.  Otherwise recommend that he follow-up with your PCP regarding ongoing pyelonephritis for reevaluation.  Recommend Tylenol and ibuprofen as needed for pain.

## 2022-07-15 NOTE — ED Provider Notes (Signed)
[facility] EMERGENCY DEPARTMENT Provider Note   CSN: [id number] Arrival date & time: 07/15/22  1059     History  Chief Complaint  Patient presents with   Back Pain   Polyuria   HPI Loretta [name] is a 36 y.o. female with lower back pain and polyuria. Symptoms started approximately 2 weeks ago.  Back pain is in the general lower back. At times the pain radiates to the lower half of her abdomen. Patient states that she is not pregnant nor is she sexually active.  Denies saddle anesthesia, urinary or bowel incontinence.  States that range of motion of her lower back is intact but sometimes painful when she moves side to side.  Patient mentioned that she has had increased urinary frequency along with malodorous urine but denies painful urination.  Also denies fever.  For her urinary frequency and malodorous urine she has been taking cranberry capsules intermittently.  She is concerned that she may have a UTI.    Back Pain      Home Medications Prior to Admission medications   Medication Sig Start Date End Date Taking? Authorizing Provider  ciprofloxacin (CIPRO) 500 MG tablet Take 1 tablet (500 mg total) by mouth 2 (two) times daily. 07/15/22  Yes Gareth Eagle, PA-C  famotidine (PEPCID) 20 MG tablet Take 1 tablet (20 mg total) by mouth 2 (two) times daily. 07/07/20   Sandre Kitty, MD  lansoprazole (PREVACID) 15 MG capsule Take 15 mg by mouth daily at 12 noon.    [provider]  oxyCODONE (OXY IR/ROXICODONE) 5 MG immediate release tablet Take 1 tablet (5 mg total) by mouth every 6 (six) hours as needed for severe pain. 05/07/20   Gaynelle Adu, MD      Allergies    Patient has no known allergies.    Review of Systems   Review of Systems  Endocrine: Positive for polyuria.  Musculoskeletal:  Positive for back pain.    Physical Exam Updated Vital Signs BP 104/87   Pulse (!) 104   Temp 98.7 F (37.1 C) (Oral)   Resp 18   Wt 72.6  kg   LMP 06/23/2022   SpO2 98%   BMI 31.25 kg/m  Physical Exam Vitals and nursing note reviewed.  HENT:     Head: Normocephalic and atraumatic.     Mouth/Throat:     Mouth: Mucous membranes are moist.  Eyes:     General:        Right eye: No discharge.        Left eye: No discharge.     Conjunctiva/sclera: Conjunctivae normal.  Cardiovascular:     Rate and Rhythm: Normal rate and regular rhythm.     Pulses: Normal pulses.     Heart sounds: Normal heart sounds.  Pulmonary:     Effort: Pulmonary effort is normal.     Breath sounds: Normal breath sounds.  Abdominal:     General: Abdomen is flat.     Palpations: Abdomen is soft.     Tenderness: There is abdominal tenderness in the right lower quadrant, suprapubic area and left lower quadrant. There is right CVA tenderness and left CVA tenderness.  Musculoskeletal:     Thoracic back: Normal.     Lumbar back: Normal.     Comments: No midline tenderness of the spine  Skin:    General: Skin is warm and dry.  Neurological:     General: No focal deficit present.  Psychiatric:  Mood and Affect: Mood normal.     ED Results / Procedures / Treatments   Labs (all labs ordered are listed, but only abnormal results are displayed) Labs Reviewed  URINALYSIS, ROUTINE W REFLEX MICROSCOPIC - Abnormal; Notable for the following components:      Result Value   Color, Urine STRAW (*)    Hgb urine dipstick SMALL (*)    Leukocytes,Ua MODERATE (*)    Bacteria, UA RARE (*)    All other components within normal limits  CBC WITH DIFFERENTIAL/PLATELET - Abnormal; Notable for the following components:   RBC 5.22 (*)    Hemoglobin 15.3 (*)    Platelets 434 (*)    All other components within normal limits  BASIC METABOLIC PANEL - Abnormal; Notable for the following components:   Sodium 134 (*)    Potassium 3.4 (*)    CO2 18 (*)    Glucose, Bld 103 (*)    All other components within normal limits  POC URINE PREG, ED     EKG None  Radiology No results found.  Procedures Procedures    Medications Ordered in ED Medications  ciprofloxacin (CIPRO) tablet 500 mg (has no administration in time range)  ibuprofen (ADVIL) tablet 600 mg (600 mg Oral Given 07/15/22 1202)    ED Course/ Medical Decision Making/ A&P                           Medical Decision Making Amount and/or Complexity of Data Reviewed Labs: ordered.   This patient presents to the ED for concern of lower back pain and polyuria, this involves a number of treatment options, and is a complaint that carries with it a lower back pain and polyuria risk of complications and morbidity.  The differential diagnosis includes pyelonephritis, renal stone, UTI, and trauma.   Co morbidities: Discussed in HPI   EMR reviewed including pt PMHx, past surgical history and past visits to ER.   See HPI for more details   Lab Tests:   I ordered and independently interpreted labs. Labs notable for   Imaging Studies:  No imaging studies ordered for this patient    Cardiac Monitoring:  The patient was maintained on a cardiac monitor.  I personally viewed and interpreted the cardiac monitored which showed an underlying rhythm of: Sinus tachycardia  NA   Medicines ordered:  I ordered medication including ibuprofen for pain and Cipro for pyelonephritis. Reevaluation of the patient after these medicines showed that the patient improved I have reviewed the patients home medicines and have made adjustments as needed    Consults/Attending Physician   I discussed this case with my attending physician who cosigned this note including patient's presenting symptoms, physical exam, and planned diagnostics and interventions. Attending physician stated agreement with plan or made changes to plan which were implemented.   Reevaluation:  After the interventions noted above I re-evaluated patient and found that they have  :improved    Problem List / ED Course:  Presenting for lower back pain and polyuria.  Exam revealed bilateral CVA tenderness and suprapubic tenderness but otherwise range of motion of lower back normal with no midline tenderness.  UA also revealed polyuria bacteria and small amount of hematuria. I did ask her about her last menstrual period which she stated was first of September.  She states that her period is usually irregular so she is unsure if she is starting another cycle.  My suspicion is that  hematuria is likely related to her UTI.  Given that she does have UTI and bilateral flank tenderness, going to treat her for pyelonephritis.  Upon reevaluation after taking ibuprofen for pain she stated that her pain is still present but better.  Treated her for pyelonephritis with ciprofloxacin.  Discussed return precautions.  Advised her to follow-up with PCP.   Dispostion:  After consideration of the diagnostic results and the patients response to treatment, I feel that the patent would benefit from discharge home on ciprofloxacin for treatment of pyelonephritis and subsequent follow-up with PCP regarding ongoing infection.         Final Clinical Impression(s) / ED Diagnoses Final diagnoses:  Pyelonephritis  Low back pain, unspecified back pain laterality, unspecified chronicity, unspecified whether sciatica present    Rx / DC Orders ED Discharge Orders          Ordered    ciprofloxacin (CIPRO) 500 MG tablet  2 times daily        07/15/22 1411              Harriet Pho, PA-C 07/15/22 1421    Audley Hose, MD 07/15/22 1455

## 2022-07-15 NOTE — ED Triage Notes (Signed)
Pt/translator stated, Ive had back pain for 2 weeks with a lot of urine. The pain comes and goes.

## 2022-07-21 ENCOUNTER — Ambulatory Visit (INDEPENDENT_AMBULATORY_CARE_PROVIDER_SITE_OTHER): Payer: Self-pay | Admitting: Family Medicine

## 2022-07-21 ENCOUNTER — Telehealth: Payer: Self-pay | Admitting: Family Medicine

## 2022-07-21 ENCOUNTER — Encounter: Payer: Self-pay | Admitting: Family Medicine

## 2022-07-21 VITALS — BP 127/95 | HR 106 | Ht 60.0 in | Wt 160.4 lb

## 2022-07-21 DIAGNOSIS — R739 Hyperglycemia, unspecified: Secondary | ICD-10-CM

## 2022-07-21 DIAGNOSIS — R300 Dysuria: Secondary | ICD-10-CM

## 2022-07-21 DIAGNOSIS — N159 Renal tubulo-interstitial disease, unspecified: Secondary | ICD-10-CM

## 2022-07-21 DIAGNOSIS — R350 Frequency of micturition: Secondary | ICD-10-CM

## 2022-07-21 LAB — POCT GLYCOSYLATED HEMOGLOBIN (HGB A1C): Hemoglobin A1C: 5.4 % (ref 4.0–5.6)

## 2022-07-21 LAB — POCT URINALYSIS DIP (MANUAL ENTRY)
Bilirubin, UA: NEGATIVE
Glucose, UA: NEGATIVE mg/dL
Ketones, POC UA: NEGATIVE mg/dL
Nitrite, UA: NEGATIVE
Protein Ur, POC: NEGATIVE mg/dL
Spec Grav, UA: 1.01 (ref 1.010–1.025)
Urobilinogen, UA: 0.2 E.U./dL
pH, UA: 7 (ref 5.0–8.0)

## 2022-07-21 LAB — POCT UA - MICROSCOPIC ONLY: Epithelial cells, urine per micros: >20

## 2022-07-21 MED ORDER — FLUCONAZOLE 150 MG PO TABS: 150.0000 mg | ORAL_TABLET | Freq: Once | ORAL | 0 refills | Status: AC

## 2022-07-21 NOTE — Telephone Encounter (Signed)
Test result discussed. UA shows blood. She just got off her period. Urine culture is pending. I discussed few yeast in her urine. I will treat given her dysuria. She agreed with the plan. Diflucan to pharmacy.

## 2022-07-21 NOTE — Patient Instructions (Signed)

## 2022-07-21 NOTE — Progress Notes (Signed)
    SUBJECTIVE:   CHIEF COMPLAINT / HPI:   Dysuria  This is a new (C/O burning urination since today. She was recently seen at the ED for B/L flank pain and polyuria. Her flank pain improved, but now she has dysuria) problem. The problem occurs every urination. The problem has been unchanged. The quality of the pain is described as burning. The pain is at a severity of 3/10. There has been no fever. She is Not sexually active (LMP: 07/17/22). There is No history of pyelonephritis. Associated symptoms include frequency. Pertinent negatives include no chills, discharge, flank pain, hematuria, hesitancy, nausea or vomiting. Associated symptoms comments: She still has polyuria and has been drinking a lot of water.. She has tried antibiotics for the symptoms. The treatment provided moderate relief. There is no history of kidney stones, recurrent UTIs or a single kidney. Hx of UTI in the past.  She was started on Ciprofloxacin from the ED which makes her tired. She has two more doses to complete her Ciprofloxacin therapy.   PERTINENT  PMH / PSH: PMHx reviewed  OBJECTIVE:   BP (!) 127/95   Pulse (!) 106   Ht 5' (1.524 m)   Wt 160 lb 6.4 oz (72.8 kg)   LMP 06/23/2022   SpO2 100%   BMI 31.33 kg/m   Physical Exam Vitals and nursing note reviewed.  Cardiovascular:     Rate and Rhythm: Normal rate and regular rhythm.     Heart sounds: Normal heart sounds. No murmur heard. Pulmonary:     Effort: Pulmonary effort is normal. No respiratory distress.     Breath sounds: Normal breath sounds. No wheezing.  Abdominal:     General: Abdomen is flat. Bowel sounds are normal. There is no distension.     Palpations: Abdomen is soft. There is no mass.     Tenderness: There is no abdominal tenderness.     Comments: Neg CVT      ASSESSMENT/PLAN:  Dysuria/Flank pain: ?? UTI vs Pyelonephritis UA positive for hematuria and yeast. She stated that she just got off her period. I will treat for yeast given  her dysuria. Diflucan escribed.  F/U urine culture report. F/U as needed. She agreed with the plan.  Increased urine frequency/Polydipsia A1C neg for DM. She might be urinating a lot since she is drinking a lot. Monitor closely for now.    Andrena Mews, MD [facility]

## 2022-07-23 LAB — URINE CULTURE: Organism ID, Bacteria: NO GROWTH

## 2022-07-24 ENCOUNTER — Telehealth: Payer: Self-pay | Admitting: Family Medicine

## 2022-07-24 NOTE — Telephone Encounter (Signed)
Urine culture discussed with her. She completed yeast treatment and her dysuria has resolved. She will f/u as needed. She was appreciative of the call.  [place] interpreter used.

## 2022-08-23 DIAGNOSIS — Z419 Encounter for procedure for purposes other than remedying health state, unspecified: Secondary | ICD-10-CM | POA: Diagnosis not present

## 2022-08-25 ENCOUNTER — Encounter: Payer: Self-pay | Admitting: Family Medicine

## 2022-08-25 ENCOUNTER — Other Ambulatory Visit (HOSPITAL_COMMUNITY)
Admission: RE | Admit: 2022-08-25 | Discharge: 2022-08-25 | Disposition: A | Discharge status: Home / Self Care | Payer: Medicaid Other | Source: Ambulatory Visit | Attending: Family Medicine | Admitting: Family Medicine

## 2022-08-25 ENCOUNTER — Ambulatory Visit (INDEPENDENT_AMBULATORY_CARE_PROVIDER_SITE_OTHER): Payer: Medicaid Other | Admitting: Family Medicine

## 2022-08-25 VITALS — BP 150/91 | HR 105 | Wt 161.1 lb

## 2022-08-25 DIAGNOSIS — Z01419 Encounter for gynecological examination (general) (routine) without abnormal findings: Secondary | ICD-10-CM | POA: Diagnosis not present

## 2022-08-25 DIAGNOSIS — R300 Dysuria: Secondary | ICD-10-CM | POA: Diagnosis not present

## 2022-08-25 DIAGNOSIS — Z124 Encounter for screening for malignant neoplasm of cervix: Secondary | ICD-10-CM

## 2022-08-25 DIAGNOSIS — N898 Other specified noninflammatory disorders of vagina: Secondary | ICD-10-CM | POA: Diagnosis not present

## 2022-08-25 LAB — POCT URINALYSIS DIP (CLINITEK)
Bilirubin, UA: NEGATIVE
Glucose, UA: NEGATIVE mg/dL
Leukocytes, UA: NEGATIVE
Nitrite, UA: NEGATIVE
POC PROTEIN,UA: NEGATIVE
Spec Grav, UA: 1.01 (ref 1.010–1.025)
Urobilinogen, UA: 0.2 E.U./dL
pH, UA: 7 (ref 5.0–8.0)

## 2022-08-25 LAB — POCT WET PREP (WET MOUNT)
Clue Cells Wet Prep Whiff POC: NEGATIVE
Trichomonas Wet Prep HPF POC: ABSENT

## 2022-08-25 LAB — POCT UA - MICROSCOPIC ONLY
Epithelial cells, urine per micros: >20
WBC, Ur, HPF, POC: NONE SEEN (ref 0–5)

## 2022-08-25 NOTE — Patient Instructions (Signed)
It was nice seeing you today!  Blood work today.  See me in 3 months or whenever is a good for you.  Stay well, Wandell Scullion, MD [facility] [phone]  --  Make sure to check out at the front desk before you leave today.  Please arrive at least 15 minutes prior to your scheduled appointments.  If you had blood work today, I will send you a MyChart message or a letter if results are normal. Otherwise, I will give you a call.  If you had a referral placed, they will call you to set up an appointment. Please give us a call if you don't hear back in the next 2 weeks.  If you need additional refills before your next appointment, please call your pharmacy first.  

## 2022-08-25 NOTE — Progress Notes (Signed)
    SUBJECTIVE:   CHIEF COMPLAINT / HPI:  Chief Complaint  Patient presents with   Dysuria    Reports she has had intermittent dysuria for the past month. Denies urinary frequency, fever, chills, back pain, abdominal pain, flank pain, vaginal discharge. She has not had sexual intercourse in 2 years.  She works at a [employer].  PERTINENT  PMH / PSH: noncontributory  Patient Care Team: Zola Button, MD as PCP - General (Family Medicine)   OBJECTIVE:   BP (!) 150/91   Pulse (!) 105   Wt 161 lb 2 oz (73.1 kg)   SpO2 100%   BMI 31.47 kg/m   Physical Exam Exam conducted with a chaperone present.  Constitutional:      General: She is not in acute distress. Cardiovascular:     Rate and Rhythm: Normal rate.  Pulmonary:     Effort: Pulmonary effort is normal. No respiratory distress.  Genitourinary:    General: Normal vulva.     Comments: Small amount of clear vaginal discharge visualized.  Cervix appears normal. Musculoskeletal:     Cervical back: Neck supple.  Neurological:     Mental Status: She is alert.         08/25/2022    3:42 PM  Depression screen PHQ 2/9  Decreased Interest 0  Down, Depressed, Hopeless 0  PHQ - 2 Score 0  Altered sleeping 0  Tired, decreased energy 0  Change in appetite 0  Feeling bad or failure about yourself  0  Trouble concentrating 0  Moving slowly or fidgety/restless 0  Suicidal thoughts 0  PHQ-9 Score 0     {Show previous vital signs (optional):23777}    ASSESSMENT/PLAN:   Dysuria Etiology unclear at this time, low suspicion for UTI or yeast infection based on UA and wet mount.  Will await urine culture.   HCM - pap smear performed today  No follow-ups on file.   Zola Button, MD [place]

## 2022-08-28 LAB — URINE CULTURE

## 2022-08-29 LAB — CYTOLOGY - PAP
Comment: NEGATIVE
Diagnosis: NEGATIVE
High risk HPV: NEGATIVE

## 2022-09-01 ENCOUNTER — Ambulatory Visit (INDEPENDENT_AMBULATORY_CARE_PROVIDER_SITE_OTHER): Payer: Medicaid Other | Admitting: Family Medicine

## 2022-09-01 ENCOUNTER — Telehealth: Payer: Self-pay | Admitting: Family Medicine

## 2022-09-01 ENCOUNTER — Other Ambulatory Visit: Payer: Self-pay

## 2022-09-01 VITALS — BP 139/91 | HR 117 | Wt 163.2 lb

## 2022-09-01 DIAGNOSIS — I10 Essential (primary) hypertension: Secondary | ICD-10-CM

## 2022-09-01 MED ORDER — AMLODIPINE BESYLATE-VALSARTAN 5-160 MG PO TABS: 1.0000 | ORAL_TABLET | Freq: Every day | ORAL | 0 refills | Status: DC

## 2022-09-01 NOTE — Patient Instructions (Addendum)
It was great seeing you today!  Today we started you on medication for your high blood pressure which you will take once daily.   As we discussed try to incorporate exercise, 150 minutes of moderate intensity and eating healthy balanced meals with vegetables   Please check-out at the front desk before leaving the clinic. I'd like to see you back in 2 weeks but if you need to be seen earlier than that for any new issues we're happy to fit you in, just give [place] a call!  Visit Reminders: - Stop by the pharmacy to pick up your prescriptions  - Continue to work on your healthy eating habits and incorporating exercise into your daily life.   Feel free to call with any questions or concerns at any time, at [phone].   Take care,  Dr. Cora Collum [facility]   Fue genial verte hoy!  Hoy comenzamos a tomar medicamentos para la presin arterial alta que tomar Medical sales representative.  Como comentamos intenta incorporar ejercicio, 150 minutos de intensidad moderada y [place] alimentacin sana y equilibrada con verduras.  Realice el check-out en la recepcin antes de salir de la clnica. Me gustara volver a verte en 2 semanas, pero si necesitas que te atendamos antes por cualquier problema nuevo, estaremos encantados de atenderte, llmanos!  Recordatorios de visita: - Pasa por la farmacia a recoger tus recetas - Continuar trabajando en tus hbitos alimentarios saludables e incorporando el ejercicio a tu vida diaria.  Si tiene alguna pregunta o inquietud, no dude en llamarnos en cualquier momento al [phone].   Cuidarse, Dra. Cora Collum [facility]

## 2022-09-01 NOTE — Telephone Encounter (Signed)
Patient is requesting her prescriptions be sent to [facility]  at [address] [place]  [place]

## 2022-09-01 NOTE — Progress Notes (Unsigned)
    SUBJECTIVE:   CHIEF COMPLAINT / HPI:   Patient presents for elevated blood pressure   Last visit BP was 150/91 and on repeat 142/89 BP 161/92 today  States she took BP measurements at home and recorded them on her phone- was 136/93 with a HR of 144 She states she has not known about her elevated heart rate Denies chest pain, palpitations, SOB   Not exercising, states she doesn't have time with work   PERTINENT  PMH / PSH: Reviewed   OBJECTIVE:   BP (!) 161/92   Pulse (!) 117   Wt 163 lb 3.2 oz (74 kg)   SpO2 100%   BMI 31.87 kg/m    Physical exam General: well appearing, NAD Cardiovascular: RRR, no murmurs Lungs: CTAB. Normal WOB Abdomen: soft, non-distended, non-tender Skin: warm, dry. No edema  ASSESSMENT/PLAN:   No problem-specific Assessment & Plan notes found for this encounter.   HTN BP 161/92. Started  Check BMP in 2 weeks and at that time can also get lipid panel, Hep C  Cora Collum, DO [facility]

## 2022-09-04 MED ORDER — AMLODIPINE BESYLATE-VALSARTAN 5-160 MG PO TABS: 1.0000 | ORAL_TABLET | Freq: Every day | ORAL | 0 refills | Status: DC

## 2022-09-04 NOTE — Telephone Encounter (Signed)
Prescription for amlodipine-valsartan sent to requested pharmacy.

## 2022-09-19 ENCOUNTER — Encounter: Payer: Self-pay | Admitting: Family Medicine

## 2022-09-19 ENCOUNTER — Ambulatory Visit (INDEPENDENT_AMBULATORY_CARE_PROVIDER_SITE_OTHER): Payer: Medicaid Other | Admitting: Family Medicine

## 2022-09-19 VITALS — BP 143/96 | HR 108 | Ht 60.0 in | Wt 160.0 lb

## 2022-09-19 DIAGNOSIS — I10 Essential (primary) hypertension: Secondary | ICD-10-CM | POA: Diagnosis not present

## 2022-09-19 DIAGNOSIS — Z1159 Encounter for screening for other viral diseases: Secondary | ICD-10-CM | POA: Diagnosis not present

## 2022-09-19 MED ORDER — AMLODIPINE BESYLATE-VALSARTAN 5-160 MG PO TABS: 1.0000 | ORAL_TABLET | Freq: Every day | ORAL | 0 refills | Status: DC

## 2022-09-19 NOTE — Assessment & Plan Note (Signed)
Patient presents to follow up on BP after starting amlodipine-valsartan 5-160 mg at last visit on 11/10, BP initially 148/98 today and on repeat was 143/96. Tearful during exam and reluctant to increase dose, will keep current dose and check kidney function today. Will follow up in 1 month and if BP still elevated will increase dose. Discussed taking med at night to reduce side effects. Discussed healthy eating, low sodium, and regular exercise to also help with blood pressure - BMP

## 2022-09-19 NOTE — Patient Instructions (Addendum)
It was great seeing you today!  You are getting blood work today to check your kidney function after starting the blood pressure medication.  You can start taking it at night to help reduce some of the side effects. We won't increase the medication at this time but continue to eat healthy low-sodium meals with vegetables at each meal, and getting in physical activity 150 minutes a week.  We will recheck in 1 month and if still elevated at that time we will increase the dose.  Feel free to call with any questions or concerns at any time, at [phone].   Take care,  Dr. Cora Collum [facility]   Fue genial verte hoy!  Hoy le harn anlisis de sangre para comprobar la funcin renal despus de comenzar a tomar medicamentos para la presin arterial. Puedes empezar a tomarlo por la noche para ayudar a reducir algunos de los efectos secundarios. No aumentaremos la medicacin en este momento, pero continuaremos comiendo comidas saludables bajas en sodio con verduras en cada comida y realizando actividad fsica 150 minutos a la semana. Volveremos a comprobar en 1 mes y si todava est elevado en ese momento aumentaremos la dosis.  Si tiene alguna pregunta o inquietud, no dude en llamarnos en cualquier momento al [phone].   Cuidarse, Dra. Cora Collum [facility]

## 2022-09-19 NOTE — Progress Notes (Signed)
    SUBJECTIVE:   CHIEF COMPLAINT / HPI:   Patient presents for follow-up on her blood pressure. Patient was seen on 11/10 and she was started on amlodipine-valsartan 5-160 mg advised to return today to check labs. She states she has been taking it every day including today. States she has had a little bit of burning in the center of her stomach that started when she started taking the med but states it goes away during the day. She is taking it with food. Also feels some dizziness. Has been watching her diet and working out.   PERTINENT  PMH / PSH: Reviewed  OBJECTIVE:   BP (!) 143/96   Pulse (!) 108   Ht 5' (1.524 m)   Wt 160 lb (72.6 kg)   SpO2 98%   BMI 31.25 kg/m    Physical exam General: well appearing, tearful, NAD Cardiovascular: RRR, no murmurs Lungs: CTAB. Normal WOB Abdomen: soft, non-distended, non-tender Skin: warm, dry. No edema  ASSESSMENT/PLAN:   Hypertension Patient presents to follow up on BP after starting amlodipine-valsartan 5-160 mg at last visit on 11/10, BP initially 148/98 today and on repeat was 143/96. Tearful during exam and reluctant to increase dose, will keep current dose and check kidney function today. Will follow up in 1 month and if BP still elevated will increase dose. Discussed taking med at night to reduce side effects. Discussed healthy eating, low sodium, and regular exercise to also help with blood pressure - BMP   Health maintenance - hep C screen   Cora Collum, DO [facility]

## 2022-09-20 LAB — BASIC METABOLIC PANEL
BUN/Creatinine Ratio: 20 (ref 9–23)
BUN: 13 mg/dL (ref 6–20)
CO2: 23 mmol/L (ref 20–29)
Calcium: 10.6 mg/dL — ABNORMAL HIGH (ref 8.7–10.2)
Chloride: 100 mmol/L (ref 96–106)
Creatinine, Ser: 0.64 mg/dL (ref 0.57–1.00)
Glucose: 97 mg/dL (ref 70–99)
Potassium: 4.6 mmol/L (ref 3.5–5.2)
Sodium: 138 mmol/L (ref 134–144)
eGFR: 118 mL/min/{1.73_m2} (ref >59)

## 2022-09-20 LAB — HCV INTERPRETATION

## 2022-09-20 LAB — HCV AB W REFLEX TO QUANT PCR: HCV Ab: NONREACTIVE

## 2022-09-22 ENCOUNTER — Telehealth: Payer: Self-pay | Admitting: *Deleted

## 2022-09-22 DIAGNOSIS — Z419 Encounter for procedure for purposes other than remedying health state, unspecified: Secondary | ICD-10-CM | POA: Diagnosis not present

## 2022-09-22 NOTE — Telephone Encounter (Signed)
Patient called (used pacific interpreter [phone]) about still having issues with her medication: Sleep issues, burning in top part of stomach, nausea and is taking medication at night.  Patient would like to talk with provider about changing to another medication.  Cloud Graham,CMA

## 2022-09-25 ENCOUNTER — Telehealth: Payer: Self-pay | Admitting: Family Medicine

## 2022-09-25 NOTE — Telephone Encounter (Signed)
Called patient with spanish translator to discuss blood pressure medication side effects. She did not answer and we left voicemail recommending she hold the medication. Advised her to call the office to schedule an appointment with Dr. Raymondo Band for ambulatory blood pressure monitoring which will better direct [place] to which medication if any is needed.

## 2022-10-10 ENCOUNTER — Ambulatory Visit (INDEPENDENT_AMBULATORY_CARE_PROVIDER_SITE_OTHER): Payer: Medicaid Other | Admitting: Family Medicine

## 2022-10-10 ENCOUNTER — Encounter: Payer: Self-pay | Admitting: Family Medicine

## 2022-10-10 VITALS — BP 132/80 | Temp 98.9°F | Ht 60.0 in | Wt 155.8 lb

## 2022-10-10 DIAGNOSIS — I10 Essential (primary) hypertension: Secondary | ICD-10-CM | POA: Diagnosis not present

## 2022-10-10 DIAGNOSIS — M542 Cervicalgia: Secondary | ICD-10-CM

## 2022-10-10 MED ORDER — AMLODIPINE BESYLATE-VALSARTAN 5-160 MG PO TABS: 1.0000 | ORAL_TABLET | Freq: Every day | ORAL | 3 refills | Status: DC

## 2022-10-10 NOTE — Patient Instructions (Addendum)
It was great seeing you today!  Continue your blood pressure medication every day  For your neck and shoulder pain discussed I recommend doing stretches, alternating between ice and heat when the pain flares up, and using ibuprofen as needed.  Continue staying active and eating a well balanced diet.   I will see you back next year for your physical, but if you need to be seen earlier than that for any new issues we're happy to fit you in, just give [place] a call!  Visit Reminders: - Stop by the pharmacy to pick up your prescriptions  - Continue to work on your healthy eating habits and incorporating exercise into your daily life.   Feel free to call with any questions or concerns at any time, at [phone].   Take care,  Dr. Cora Collum [facility]  Fue genial verte hoy!  Contine con su medicacin para la presin arterial [employer].  Para el dolor de cuello y hombros del que hablamos, recomiendo hacer estiramientos, alternar entre hielo y calor cuando el dolor aumenta y usar ibuprofeno segn sea necesario.  Contine mantenindose [place] y comiendo [place].  Nos vemos el ao que viene para tu examen fsico, pero si necesitas que te atiendan antes por cualquier problema nuevo, estaremos encantados de atenderte, llmanos!  Recordatorios de visita: - Pasa por la farmacia a recoger tus recetas - Continuar trabajando en tus hbitos alimentarios saludables e incorporando el ejercicio a tu vida diaria.  Si tiene alguna pregunta o inquietud, no dude en llamarnos en cualquier momento al [phone].   Cuidarse, Dra. Cora Collum [facility]

## 2022-10-10 NOTE — Progress Notes (Signed)
    SUBJECTIVE:   CHIEF COMPLAINT / HPI:   Patient presents for follow-up on her blood pressure.  Seen in clinic about 2 weeks ago and was started on Amlodipine-valsartan 5-160 daily. Tolerating it well without any side effects. Initially was having stomach discomfort but that resolved.   Asks about neck and shoulder pain which she has had for the past couple of months. Denies headaches or radiation of pain. Comes and goes. No extremity weakness. Has not tried anything for it  PERTINENT  PMH / PSH: Reviewed   OBJECTIVE:   BP 132/80   Temp 98.9 F (37.2 C)   Ht 5' (1.524 m)   Wt 155 lb 12.8 oz (70.7 kg)   LMP 10/07/2022 (Exact Date)   SpO2 100%   BMI 30.43 kg/m    Physical exam General: well appearing, NAD Cardiovascular: RRR, no murmurs Lungs: CTAB. Normal WOB Abdomen: soft, non-distended, non-tender Skin: warm, dry. No edema MSK Neck/Back: - Inspection: no gross deformity or asymmetry, swelling or ecchymosis - Palpation: Mild TTP over rhomboid and trapezius  - ROM: full active ROM of the cervical spine with neck extension, rotation, flexion - pain in all directions - Neuro: sensation intact  - Special testing: negative spurling's  ASSESSMENT/PLAN:   No problem-specific Assessment & Plan notes found for this encounter.   HTN BP 132/80 improved after iniitiating Amlodipine-valsartan 5-160 in the beginning of November. Kidney function at 2 week follow up was normal. Patient initially had side effects of GI discomfort but it has resolved. Will continue current regimen   Neck pain Occurring for the past couple of months. Likely muscle strain vs stress related. No red flag symptoms and unlikely to be radiculopathy given no radiation of pain. Discussed OTC pain medication as needed, alternating between ice and heat, and stretching/exercising. Provided some stretches to do. Return precautions discussed.   Cora Collum, DO [facility]

## 2022-10-11 DIAGNOSIS — M542 Cervicalgia: Secondary | ICD-10-CM | POA: Insufficient documentation

## 2022-10-11 NOTE — Assessment & Plan Note (Signed)
BP 132/80 improved after iniitiating Amlodipine-valsartan 5-160 in the beginning of November. Kidney function at 2 week follow up was normal. Patient initially had side effects of GI discomfort but it has resolved. Will continue current regimen

## 2022-10-11 NOTE — Assessment & Plan Note (Signed)
Occurring for the past couple of months. Likely muscle strain vs stress related. No red flag symptoms and unlikely to be radiculopathy given no radiation of pain. Discussed OTC pain medication as needed, alternating between ice and heat, and stretching/exercising. Provided some stretches to do. Return precautions discussed.

## 2022-10-23 DIAGNOSIS — Z419 Encounter for procedure for purposes other than remedying health state, unspecified: Secondary | ICD-10-CM | POA: Diagnosis not present

## 2022-11-23 DIAGNOSIS — Z419 Encounter for procedure for purposes other than remedying health state, unspecified: Secondary | ICD-10-CM | POA: Diagnosis not present

## 2022-11-24 ENCOUNTER — Emergency Department (HOSPITAL_COMMUNITY): Payer: Medicaid Other

## 2022-11-24 ENCOUNTER — Encounter (HOSPITAL_COMMUNITY): Payer: Self-pay

## 2022-11-24 ENCOUNTER — Other Ambulatory Visit: Payer: Self-pay

## 2022-11-24 ENCOUNTER — Observation Stay (HOSPITAL_COMMUNITY)
Admission: EM | Admit: 2022-11-24 | Discharge: 2022-11-25 | Disposition: A | Discharge status: Home / Self Care | Payer: Medicaid Other | Attending: Surgery | Admitting: Surgery

## 2022-11-24 DIAGNOSIS — Z79899 Other long term (current) drug therapy: Secondary | ICD-10-CM | POA: Diagnosis not present

## 2022-11-24 DIAGNOSIS — I10 Essential (primary) hypertension: Secondary | ICD-10-CM | POA: Diagnosis not present

## 2022-11-24 DIAGNOSIS — K3580 Unspecified acute appendicitis: Secondary | ICD-10-CM

## 2022-11-24 DIAGNOSIS — R109 Unspecified abdominal pain: Secondary | ICD-10-CM | POA: Diagnosis present

## 2022-11-24 DIAGNOSIS — Z1152 Encounter for screening for COVID-19: Secondary | ICD-10-CM | POA: Diagnosis not present

## 2022-11-24 DIAGNOSIS — R1013 Epigastric pain: Secondary | ICD-10-CM | POA: Diagnosis not present

## 2022-11-24 DIAGNOSIS — R110 Nausea: Secondary | ICD-10-CM | POA: Diagnosis not present

## 2022-11-24 DIAGNOSIS — K6389 Other specified diseases of intestine: Secondary | ICD-10-CM | POA: Diagnosis not present

## 2022-11-24 HISTORY — DX: Essential (primary) hypertension: I10

## 2022-11-24 LAB — URINALYSIS, ROUTINE W REFLEX MICROSCOPIC
Bacteria, UA: NONE SEEN
Bilirubin Urine: NEGATIVE
Glucose, UA: NEGATIVE mg/dL
Hgb urine dipstick: NEGATIVE
Ketones, ur: 80 mg/dL — AB
Nitrite: NEGATIVE
Protein, ur: 30 mg/dL — AB
Specific Gravity, Urine: 1.03 (ref 1.005–1.030)
pH: 5 (ref 5.0–8.0)

## 2022-11-24 LAB — COMPREHENSIVE METABOLIC PANEL
ALT: 29 U/L (ref 0–44)
AST: 23 U/L (ref 15–41)
Albumin: 4.1 g/dL (ref 3.5–5.0)
Alkaline Phosphatase: 63 U/L (ref 38–126)
Anion gap: 13 (ref 5–15)
BUN: 15 mg/dL (ref 6–20)
CO2: 21 mmol/L — ABNORMAL LOW (ref 22–32)
Calcium: 9.1 mg/dL (ref 8.9–10.3)
Chloride: 98 mmol/L (ref 98–111)
Creatinine, Ser: 0.72 mg/dL (ref 0.44–1.00)
GFR, Estimated: >60 mL/min (ref >60)
Glucose, Bld: 126 mg/dL — ABNORMAL HIGH (ref 70–99)
Potassium: 4.1 mmol/L (ref 3.5–5.1)
Sodium: 132 mmol/L — ABNORMAL LOW (ref 135–145)
Total Bilirubin: 0.7 mg/dL (ref 0.3–1.2)
Total Protein: 7.5 g/dL (ref 6.5–8.1)

## 2022-11-24 LAB — CBC WITH DIFFERENTIAL/PLATELET
Abs Immature Granulocytes: 0.05 10*3/uL (ref 0.00–0.07)
Basophils Absolute: 0 10*3/uL (ref 0.0–0.1)
Basophils Relative: 0 %
Eosinophils Absolute: 0 10*3/uL (ref 0.0–0.5)
Eosinophils Relative: 0 %
HCT: 40.8 % (ref 36.0–46.0)
Hemoglobin: 14 g/dL (ref 12.0–15.0)
Immature Granulocytes: 0 %
Lymphocytes Relative: 11 %
Lymphs Abs: 1.8 10*3/uL (ref 0.7–4.0)
MCH: 29.5 pg (ref 26.0–34.0)
MCHC: 34.3 g/dL (ref 30.0–36.0)
MCV: 86.1 fL (ref 80.0–100.0)
Monocytes Absolute: 0.6 10*3/uL (ref 0.1–1.0)
Monocytes Relative: 4 %
Neutro Abs: 14.5 10*3/uL — ABNORMAL HIGH (ref 1.7–7.7)
Neutrophils Relative %: 85 %
Platelets: 369 10*3/uL (ref 150–400)
RBC: 4.74 MIL/uL (ref 3.87–5.11)
RDW: 12.5 % (ref 11.5–15.5)
WBC: 17 10*3/uL — ABNORMAL HIGH (ref 4.0–10.5)
nRBC: 0 % (ref 0.0–0.2)

## 2022-11-24 LAB — LIPASE, BLOOD: Lipase: 30 U/L (ref 11–51)

## 2022-11-24 LAB — I-STAT BETA HCG BLOOD, ED (MC, WL, AP ONLY): I-stat hCG, quantitative: <5 m[IU]/mL (ref <5)

## 2022-11-24 MED ORDER — SODIUM CHLORIDE 0.9 % IV BOLUS
1000.0000 mL | Freq: Once | INTRAVENOUS | Status: AC
  Administered 2022-11-24: 1000 mL via INTRAVENOUS

## 2022-11-24 MED ORDER — ONDANSETRON HCL 4 MG/2ML IJ SOLN
4.0000 mg | Freq: Once | INTRAMUSCULAR | Status: AC
  Administered 2022-11-24: 4 mg via INTRAVENOUS
  Filled 2022-11-24: qty 2

## 2022-11-24 MED ORDER — IOHEXOL 350 MG/ML SOLN
75.0000 mL | Freq: Once | INTRAVENOUS | Status: AC | PRN
  Administered 2022-11-24: 75 mL via INTRAVENOUS

## 2022-11-24 MED ORDER — MORPHINE SULFATE (PF) 4 MG/ML IV SOLN
4.0000 mg | Freq: Once | INTRAVENOUS | Status: AC
  Administered 2022-11-24: 4 mg via INTRAVENOUS
  Filled 2022-11-24: qty 1

## 2022-11-24 MED ORDER — ALUM & MAG HYDROXIDE-SIMETH 200-200-20 MG/5ML PO SUSP
30.0000 mL | Freq: Once | ORAL | Status: AC
  Administered 2022-11-24: 30 mL via ORAL
  Filled 2022-11-24: qty 30

## 2022-11-24 NOTE — ED Provider Triage Note (Signed)
Emergency Medicine Provider Triage Evaluation Note  Loretta [name] , a 37 y.o. female  was evaluated in triage.  Pt complains of epigastric abdominal pain that started at 5 PM suddenly while sitting in the car.  Patient states that she has had this happen in the past but that was when she had to have her gallbladder taken out which she did have surgery for.  She states she has been admitted twice and still having nausea.  No fevers or chills, pain does not radiate..  Review of Systems  Positive: Abdominal pain, nausea and vomiting Negative: Function, diarrhea  Physical Exam  There were no vitals taken for this visit. Gen:   Awake, no distress   Resp:  Normal effort  MSK:   Moves extremities without difficulty  Other:    Medical Decision Making  Medically screening exam initiated at 9:20 PM.  Appropriate orders placed.  Loretta [name] was informed that the remainder of the evaluation will be completed by another provider, this initial triage assessment does not replace that evaluation, and the importance of remaining in the ED until their evaluation is complete.     Loretta [name], [place] 11/24/22 2121

## 2022-11-24 NOTE — ED Triage Notes (Signed)
Pt presents with epigastric pain that started at 5 pm today while she was driving her care. Pt reports associated N/V. Denies diarrhea.

## 2022-11-24 NOTE — ED Provider Notes (Signed)
[place] Provider Note   CSN: [id number] Arrival date & time: 11/24/22  2114     History {Add pertinent medical, surgical, social history, OB history to HPI:1} Chief Complaint  Patient presents with   Abdominal Pain    Fey Jannelly Bergren is a 37 y.o. female.  The history is provided by the patient and medical records. The history is limited by a language barrier. A language interpreter was used.  Abdominal Pain    37 year old female significant history of hypertension, GERD, prior surgical history including cholecystectomy presenting with complaints of abdominal pain.  Patient is Spanish-speaking, history obtained using language interpreter.  Patient report this afternoon she developed pain to her epigastric region.  She described pain as a sharp stabbing pain felt somewhat similar to prior gallbladder pain she had had in the past.  She endorsed nausea with some vomiting.  She also endorsed chills.  Her pain is constant and mild in severity.  She does not endorse any fever, lightheadedness, dizziness, chest pain, shortness of breath, productive cough, runny nose sneezing sore throat dysuria bowel bladder changes.  She denies alcohol or tobacco use.  She states nothing seems to make it better or worse.  Home Medications Prior to Admission medications   Medication Sig Start Date End Date Taking? Authorizing Provider  amLODipine-valsartan (EXFORGE) 5-160 MG tablet Take 1 tablet by mouth daily. 10/10/22   Shary Key, DO  ciprofloxacin (CIPRO) 500 MG tablet Take 1 tablet (500 mg total) by mouth 2 (two) times daily. 07/15/22   Harriet Pho, PA-C  famotidine (PEPCID) 20 MG tablet Take 1 tablet (20 mg total) by mouth 2 (two) times daily. Patient not taking: Reported on 07/21/2022 07/07/20   Benay Pike, MD  lansoprazole (PREVACID) 15 MG capsule Take 15 mg by mouth daily at 12 noon.    [provider]  oxyCODONE  (OXY IR/ROXICODONE) 5 MG immediate release tablet Take 1 tablet (5 mg total) by mouth every 6 (six) hours as needed for severe pain. Patient not taking: Reported on 07/21/2022 05/07/20   Greer Pickerel, MD      Allergies    Patient has no known allergies.    Review of Systems   Review of Systems  Gastrointestinal:  Positive for abdominal pain.  All other systems reviewed and are negative.   Physical Exam Updated Vital Signs BP (!) 132/95 (BP Location: Right Arm)   Pulse 85   Temp 98.5 F (36.9 C)   Resp 19   Ht 5' (1.524 m)   Wt 70.3 kg   SpO2 99%   BMI 30.27 kg/m  Physical Exam Vitals and nursing note reviewed.  Constitutional:      General: She is not in acute distress.    Appearance: She is well-developed.  HENT:     Head: Atraumatic.  Eyes:     Conjunctiva/sclera: Conjunctivae normal.  Cardiovascular:     Rate and Rhythm: Normal rate and regular rhythm.     Heart sounds: Normal heart sounds.  Pulmonary:     Effort: Pulmonary effort is normal.  Abdominal:     Tenderness: There is abdominal tenderness in the epigastric area. There is no guarding or rebound. Negative signs include Murphy's sign, Rovsing's sign and McBurney's sign.  Musculoskeletal:     Cervical back: Neck supple.  Skin:    Findings: No rash.  Neurological:     Mental Status: She is alert.  Psychiatric:  Mood and Affect: Mood normal.     ED Results / Procedures / Treatments   Labs (all labs ordered are listed, but only abnormal results are displayed) Labs Reviewed  CBC WITH DIFFERENTIAL/PLATELET - Abnormal; Notable for the following components:      Result Value   WBC 17.0 (*)    Neutro Abs 14.5 (*)    All other components within normal limits  URINALYSIS, ROUTINE W REFLEX MICROSCOPIC - Abnormal; Notable for the following components:   APPearance HAZY (*)    Ketones, ur 80 (*)    Protein, ur 30 (*)    Leukocytes,Ua TRACE (*)    All other components within normal limits   COMPREHENSIVE METABOLIC PANEL  LIPASE, BLOOD  I-STAT BETA HCG BLOOD, ED ([facility], [facility], [facility] ONLY)    EKG None  Radiology No results found.  Procedures Procedures  {Document cardiac monitor, telemetry assessment procedure when appropriate:1}  Medications Ordered in ED Medications - No data to display  ED Course/ Medical Decision Making/ A&P   {   Click here for ABCD2, HEART and other calculatorsREFRESH Note before signing :1}                          Medical Decision Making Amount and/or Complexity of Data Reviewed Radiology: ordered.  Risk OTC drugs. Prescription drug management.   BP (!) 132/95 (BP Location: Right Arm)   Pulse 85   Temp 98.5 F (36.9 C)   Resp 19   Ht 5' (1.524 m)   Wt 70.3 kg   SpO2 99%   BMI 30.27 kg/m   58:37 PM  37 year old female significant history of hypertension, GERD, prior surgical history including cholecystectomy presenting with complaints of abdominal pain.  Patient is Spanish-speaking, history obtained using language interpreter.  Patient report this afternoon she developed pain to her epigastric region.  She described pain as a sharp stabbing pain felt somewhat similar to prior gallbladder pain she had had in the past.  She endorsed nausea with some vomiting.  She also endorsed chills.  Her pain is constant and mild in severity.  She does not endorse any fever, lightheadedness, dizziness, chest pain, shortness of breath, productive cough, runny nose sneezing sore throat dysuria bowel bladder changes.  She denies alcohol or tobacco use.  She states nothing seems to make it better or worse.  On exam patient appears uncomfortable but nontoxic.  Heart with normal rate and rhythm, lungs are clear to auscultation bilaterally, abdomen with tenderness to epigastric region without guarding or rebound tenderness.  No CVA tenderness.  Vital sign review and overall reassuring, patient is afebrile, no hypoxia.  -Labs ordered, independently viewed and  interpreted by me.  Labs remarkable for UA showing 80 ketone, will give IVF.  WBC elevated at 17, will obtain CT scan for further assessment.  -The patient was maintained on a cardiac monitor.  I personally viewed and interpreted the cardiac monitored which showed an underlying rhythm of: NSR -Imaging independently viewed and interpreted by me and I agree with radiologist's interpretation.  Result remarkable for abd/pelvis CT showing mildly distended appendix, possible early appendicitis.  Will consult surgery -This patient presents to the ED for concern of abd pain, this involves an extensive number of treatment options, and is a complaint that carries with it a high risk of complications and morbidity.  The differential diagnosis includes gastritis, GERD, pancreatitis, PUD, appendicitis, UTI, kidney stone, ischemic bowel -Co morbidities that complicate the patient  evaluation includes GERD -Treatment includes morphine, zofran -Reevaluation of the patient after these medicines showed that the patient improved -PCP office notes or outside notes reviewed -Discussion with specialist *** -Escalation to admission/observation considered: patients feels much better, is comfortable with discharge, and will follow up with PCP -Prescription medication considered, patient comfortable with *** -Social Determinant of Health considered which includes ***   {Document critical care time when appropriate:1} {Document review of labs and clinical decision tools ie heart score, Chads2Vasc2 etc:1}  {Document your independent review of radiology images, and any outside records:1} {Document your discussion with family members, caretakers, and with consultants:1} {Document social determinants of health affecting pt's care:1} {Document your decision making why or why not admission, treatments were needed:1} Final Clinical Impression(s) / ED Diagnoses Final diagnoses:  None    Rx / DC Orders ED Discharge Orders      None

## 2022-11-25 ENCOUNTER — Encounter (HOSPITAL_COMMUNITY): Payer: Self-pay | Admitting: Anesthesiology

## 2022-11-25 ENCOUNTER — Encounter (HOSPITAL_COMMUNITY): Payer: Self-pay | Admitting: Surgery

## 2022-11-25 DIAGNOSIS — R1013 Epigastric pain: Secondary | ICD-10-CM | POA: Diagnosis not present

## 2022-11-25 DIAGNOSIS — R109 Unspecified abdominal pain: Secondary | ICD-10-CM | POA: Diagnosis present

## 2022-11-25 LAB — HIV ANTIBODY (ROUTINE TESTING W REFLEX): HIV Screen 4th Generation wRfx: NONREACTIVE

## 2022-11-25 LAB — CBC
HCT: 37.4 % (ref 36.0–46.0)
Hemoglobin: 12.9 g/dL (ref 12.0–15.0)
MCH: 29.4 pg (ref 26.0–34.0)
MCHC: 34.5 g/dL (ref 30.0–36.0)
MCV: 85.2 fL (ref 80.0–100.0)
Platelets: 328 10*3/uL (ref 150–400)
RBC: 4.39 MIL/uL (ref 3.87–5.11)
RDW: 12.6 % (ref 11.5–15.5)
WBC: 11.6 10*3/uL — ABNORMAL HIGH (ref 4.0–10.5)
nRBC: 0 % (ref 0.0–0.2)

## 2022-11-25 LAB — BASIC METABOLIC PANEL
Anion gap: 6 (ref 5–15)
BUN: 7 mg/dL (ref 6–20)
CO2: 24 mmol/L (ref 22–32)
Calcium: 8.4 mg/dL — ABNORMAL LOW (ref 8.9–10.3)
Chloride: 106 mmol/L (ref 98–111)
Creatinine, Ser: 0.61 mg/dL (ref 0.44–1.00)
GFR, Estimated: >60 mL/min (ref >60)
Glucose, Bld: 106 mg/dL — ABNORMAL HIGH (ref 70–99)
Potassium: 3.8 mmol/L (ref 3.5–5.1)
Sodium: 136 mmol/L (ref 135–145)

## 2022-11-25 LAB — RESP PANEL BY RT-PCR (RSV, FLU A&B, COVID)  RVPGX2
Influenza A by PCR: NEGATIVE
Influenza B by PCR: NEGATIVE
Resp Syncytial Virus by PCR: NEGATIVE
SARS Coronavirus 2 by RT PCR: NEGATIVE

## 2022-11-25 SURGERY — APPENDECTOMY, LAPAROSCOPIC
Anesthesia: General

## 2022-11-25 MED ORDER — LACTATED RINGERS IV SOLN: INTRAVENOUS | Status: DC

## 2022-11-25 MED ORDER — METRONIDAZOLE 500 MG/100ML IV SOLN: 500.0000 mg | Freq: Once | INTRAVENOUS | Status: DC

## 2022-11-25 MED ORDER — ENOXAPARIN SODIUM 40 MG/0.4ML IJ SOSY
40.0000 mg | PREFILLED_SYRINGE | INTRAMUSCULAR | Status: DC
  Administered 2022-11-25: 40 mg via SUBCUTANEOUS
  Filled 2022-11-25: qty 0.4

## 2022-11-25 MED ORDER — SIMETHICONE 80 MG PO CHEW: 80.0000 mg | CHEWABLE_TABLET | Freq: Four times a day (QID) | ORAL | Status: DC | PRN

## 2022-11-25 MED ORDER — ACETAMINOPHEN 325 MG PO TABS
650.0000 mg | ORAL_TABLET | Freq: Four times a day (QID) | ORAL | Status: DC
  Administered 2022-11-25: 650 mg via ORAL
  Filled 2022-11-25 (×2): qty 2

## 2022-11-25 MED ORDER — ONDANSETRON HCL 4 MG/2ML IJ SOLN: 4.0000 mg | Freq: Four times a day (QID) | INTRAMUSCULAR | Status: DC | PRN

## 2022-11-25 MED ORDER — PANTOPRAZOLE SODIUM 40 MG PO TBEC
40.0000 mg | DELAYED_RELEASE_TABLET | Freq: Two times a day (BID) | ORAL | Status: DC
  Administered 2022-11-25: 40 mg via ORAL
  Filled 2022-11-25 (×2): qty 1

## 2022-11-25 MED ORDER — SODIUM CHLORIDE 0.9 % IV SOLN
2.0000 g | Freq: Once | INTRAVENOUS | Status: DC
  Filled 2022-11-25: qty 20

## 2022-11-25 MED ORDER — PROCHLORPERAZINE EDISYLATE 10 MG/2ML IJ SOLN: 10.0000 mg | INTRAMUSCULAR | Status: DC | PRN

## 2022-11-25 NOTE — Discharge Summary (Signed)
Physician Discharge Summary  Patient ID: Loretta [name] MRN: [id number] DOB/AGE: [birth year] 37 y.o.  Admit date: 11/24/2022 Discharge date: 11/25/2022  Admission Diagnoses:  Discharge Diagnoses:  Principal Problem:   Abdominal pain   Discharged Condition: good  Hospital Course: Patient was admitted for serial abdominal exams after presenting with transient abdominal (epigastric) pain/emesis/chills around 5pm on 2/2. She is s/p cholecystectomy in 2021. This improved while she was in the ER. Ct was equivocal for early appendicitis. On 2/3 she remains pain free with no abdominal tenderness, a normalized WBC and normal vitals. This is without any antibiotic therapy. She is deemed stable for discharge home.    Discharge Exam: Blood pressure 103/61, pulse 91, temperature 99.6 F (37.6 C), temperature source Oral, resp. rate 18, height 5' (1.524 m), weight 69.6 kg, SpO2 100 %. A&Ox3  Unlabored resp Abd soft, nontender, nondistended  Disposition: Discharge disposition: 01-Home or Self Care        Allergies as of 11/25/2022   No Known Allergies      Medication List     STOP taking these medications    ciprofloxacin 500 MG tablet Commonly known as: CIPRO   oxyCODONE 5 MG immediate release tablet Commonly known as: Oxy IR/ROXICODONE       TAKE these medications    amLODipine-valsartan 5-160 MG tablet Commonly known as: EXFORGE Take 1 tablet by mouth daily.   famotidine 20 MG tablet Commonly known as: Pepcid Take 1 tablet (20 mg total) by mouth 2 (two) times daily.   lansoprazole 15 MG capsule Commonly known as: PREVACID Take 15 mg by mouth daily at 12 noon.        Follow-up Information     Zola Button, MD Follow up.   Specialty: Family Medicine Contact information: [place] [address] [phone]                 Signed: Clovis Riley 11/25/2022, 10:23 AM

## 2022-11-25 NOTE — Discharge Instructions (Addendum)
Contact your PCP for instructions or return to ER if you develop recurrent and persistent pain in thr right lower abdomen especially with fever, nausea. If you have recurrent upper abdomen pain, your PCP may wish to refer you for an endoscopy.

## 2022-11-25 NOTE — Plan of Care (Signed)
  Problem: Education: Goal: Knowledge of General Education information will improve Description: Including pain rating scale, medication(s)/side effects and non-pharmacologic comfort measures Outcome: Progressing   Problem: Pain Managment: Goal: General experience of comfort will improve Outcome: Progressing   Problem: Safety: Goal: Ability to remain free from injury will improve Outcome: Progressing   

## 2022-11-25 NOTE — ED Notes (Signed)
ED TO INPATIENT HANDOFF REPORT  ED Nurse Name and Phone #: [phone]   S Name/Age/Gender Loretta [name] 37 y.o. female Room/Bed: H012C/H012C  Code Status   Code Status: Full Code  Home/SNF/Other Home Patient oriented to: self, place, time, and situation Is this baseline? Yes   Triage Complete: Triage complete  Chief Complaint Abdominal pain [R10.9]  Triage Note Pt presents with epigastric pain that started at 5 pm today while she was driving her care. Pt reports associated N/V. Denies diarrhea.    Allergies No Known Allergies  Level of Care/Admitting Diagnosis ED Disposition     ED Disposition  Admit   Condition  --   Comment  Hospital Area: [facility] [[id number]]  Level of Care: Med-Surg [16]  May place patient in observation at [facility] or [facility] if equivalent level of care is available:: No  Covid Evaluation: Asymptomatic - no recent exposure (last 10 days) testing not required  Diagnosis: Abdominal pain [454098]  Admitting Physician: Holland, Vivian  Attending Physician: CCS, MD [[id number]]          B Medical/Surgery History Past Medical History:  Diagnosis Date   Acid reflux    Cholecystitis 08/2018   Hypertension    Past Surgical History:  Procedure Laterality Date   LAPAROSCOPIC CHOLECYSTECTOMY  05/07/2020   Dr. Junious Silk IV Location/Drains/Wounds Patient Lines/Drains/Airways Status     Active Line/Drains/Airways     Name Placement date Placement time Site Days   Peripheral IV 11/24/22 20 G Anterior;Right Forearm 11/24/22  2248  Forearm  1   Incision (Closed) 05/07/20 Abdomen Other (Comment) 05/07/20  1653  -- 932   Incision - 4 Ports Abdomen Right;Lateral Right;Medial Umbilicus Left 11/91/47  1650  -- 932            Intake/Output Last 24 hours No intake or output data in the 24 hours ending 11/25/22 0038  Labs/Imaging Results for orders placed or performed during the hospital encounter of  11/24/22 (from the past 48 hour(s))  CBC with Differential     Status: Abnormal   Collection Time: 11/24/22  9:34 PM  Result Value Ref Range   WBC 17.0 (H) 4.0 - 10.5 K/uL   RBC 4.74 3.87 - 5.11 MIL/uL   Hemoglobin 14.0 12.0 - 15.0 g/dL   HCT 40.8 36.0 - 46.0 %   MCV 86.1 80.0 - 100.0 fL   MCH 29.5 26.0 - 34.0 pg   MCHC 34.3 30.0 - 36.0 g/dL   RDW 12.5 11.5 - 15.5 %   Platelets 369 150 - 400 K/uL   nRBC 0.0 0.0 - 0.2 %   Neutrophils Relative % 85 %   Neutro Abs 14.5 (H) 1.7 - 7.7 K/uL   Lymphocytes Relative 11 %   Lymphs Abs 1.8 0.7 - 4.0 K/uL   Monocytes Relative 4 %   Monocytes Absolute 0.6 0.1 - 1.0 K/uL   Eosinophils Relative 0 %   Eosinophils Absolute 0.0 0.0 - 0.5 K/uL   Basophils Relative 0 %   Basophils Absolute 0.0 0.0 - 0.1 K/uL   Immature Granulocytes 0 %   Abs Immature Granulocytes 0.05 0.00 - 0.07 K/uL    Comment: Performed at [facility], [place] [address] [place], [place] [address]  Comprehensive metabolic panel     Status: Abnormal   Collection Time: 11/24/22  9:34 PM  Result Value Ref Range   Sodium 132 (L) 135 - 145 mmol/L  Potassium 4.1 3.5 - 5.1 mmol/L   Chloride 98 98 - 111 mmol/L   CO2 21 (L) 22 - 32 mmol/L   Glucose, Bld 126 (H) 70 - 99 mg/dL    Comment: Glucose reference range applies only to samples taken after fasting for at least 8 hours.   BUN 15 6 - 20 mg/dL   Creatinine, Ser 5.63 0.44 - 1.00 mg/dL   Calcium 9.1 8.9 - 14.9 mg/dL   Total Protein 7.5 6.5 - 8.1 g/dL   Albumin 4.1 3.5 - 5.0 g/dL   AST 23 15 - 41 U/L   ALT 29 0 - 44 U/L   Alkaline Phosphatase 63 38 - 126 U/L   Total Bilirubin 0.7 0.3 - 1.2 mg/dL   GFR, Estimated >70 >26 mL/min    Comment: (NOTE) Calculated using the CKD-EPI Creatinine Equation (2021)    Anion gap 13 5 - 15    Comment: Performed at [facility], [address] [place], [place] [address]  Lipase, blood     Status: None   Collection Time: 11/24/22  9:34 PM  Result Value Ref Range   Lipase 30 11 -  51 U/L    Comment: Performed at [facility], [address], [place], [place] [address]  Urinalysis, Routine w reflex microscopic -Urine, Clean Catch     Status: Abnormal   Collection Time: 11/24/22  9:35 PM  Result Value Ref Range   Color, Urine YELLOW YELLOW   APPearance HAZY (A) CLEAR   Specific Gravity, Urine 1.030 1.005 - 1.030   pH 5.0 5.0 - 8.0   Glucose, UA NEGATIVE NEGATIVE mg/dL   Hgb urine dipstick NEGATIVE NEGATIVE   Bilirubin Urine NEGATIVE NEGATIVE   Ketones, ur 80 (A) NEGATIVE mg/dL   Protein, ur 30 (A) NEGATIVE mg/dL   Nitrite NEGATIVE NEGATIVE   Leukocytes,Ua TRACE (A) NEGATIVE   RBC / HPF 6-10 0 - 5 RBC/hpf   WBC, UA 0-5 0 - 5 WBC/hpf   Bacteria, UA NONE SEEN NONE SEEN   Squamous Epithelial / HPF 6-10 0 - 5 /HPF   Mucus PRESENT     Comment: Performed at [facility], [address], [place], [place] [address]  POC beta hCG blood, ED     Status: None   Collection Time: 11/24/22 10:09 PM  Result Value Ref Range   I-stat hCG, quantitative <5.0 <5 mIU/mL   Comment 3            Comment:   GEST. AGE      CONC.  (mIU/mL)   <=1 WEEK        5 - 50     2 WEEKS       50 - 500     3 WEEKS       100 - 10,000     4 WEEKS     1,000 - 30,000        FEMALE AND NON-PREGNANT FEMALE:     LESS THAN 5 mIU/mL    CT ABDOMEN PELVIS W CONTRAST  Addendum Date: 11/24/2022   ADDENDUM REPORT: 11/24/2022 23:48 ADDENDUM: Critical Value/emergent results were called by telephone at the time of interpretation on 11/24/2022 at 11:48 pm to provider [facility] , who verbally acknowledged these results. Electronically Signed   By: Thornell Sartorius M.D.   On: 11/24/2022 23:48   Result Date: 11/24/2022 CLINICAL DATA:  Epigastric abdominal pain. EXAM: CT ABDOMEN AND PELVIS WITH CONTRAST TECHNIQUE: Multidetector  CT imaging of the abdomen and pelvis was performed using the standard protocol following bolus administration of intravenous contrast. RADIATION DOSE REDUCTION: This exam was performed  according to the departmental dose-optimization program which includes automated exposure control, adjustment of the mA and/or kV according to patient size and/or use of iterative reconstruction technique. CONTRAST:  39mL OMNIPAQUE IOHEXOL 350 MG/ML SOLN COMPARISON:  None Available. FINDINGS: Lower chest: No acute abnormality. Hepatobiliary: No focal liver abnormality is seen. No biliary ductal dilatation. The gallbladder is not seen. Pancreas: Unremarkable. No pancreatic ductal dilatation or surrounding inflammatory changes. Spleen: Normal in size without focal abnormality. Adrenals/Urinary Tract: The adrenal glands are within normal limits. The kidneys enhance symmetrically. No renal calculus or hydronephrosis. The bladder is unremarkable. Stomach/Bowel: Stomach is within normal limits. The appendix is prominent in size at 7 mm and there is mild surrounding fat stranding. No bowel obstruction, free air or pneumatosis. Vascular/Lymphatic: No significant vascular findings are present. No enlarged abdominal or pelvic lymph nodes. Reproductive: The uterus is within normal limits.  No adnexal mass. Other: Abdominopelvic ascites. Small fat containing umbilical hernia. Musculoskeletal: Sclerosis is noted at the sacroiliac joints bilaterally, compatible with sacroiliitis. No acute osseous abnormality. IMPRESSION: Mildly distended appendix measuring 7 mm with surrounding fat stranding, possible early appendicitis. Surgical consultation is recommended. Electronically Signed: By: Brett Fairy M.D. On: 11/24/2022 23:42    Pending Labs Unresulted Labs (From admission, onward)     Start     Ordered   12/02/22 0500  Creatinine, serum  (enoxaparin (LOVENOX)    CrCl >/= 30 ml/min)  Weekly,   R     Comments: while on enoxaparin therapy    11/25/22 0032   11/25/22 1761  Basic metabolic panel  Daily,   R      11/25/22 0032   11/25/22 0500  CBC  Daily,   R      11/25/22 0032   11/25/22 0031  HIV Antibody (routine  testing w rflx)  (HIV Antibody (Routine testing w reflex) panel)  Once,   R        11/25/22 0032   11/25/22 0031  CBC  (enoxaparin (LOVENOX)    CrCl >/= 30 ml/min)  Once,   R       Comments: Baseline for enoxaparin therapy IF NOT ALREADY DRAWN.  Notify MD if PLT < 100 K.    11/25/22 0032   11/25/22 0031  Creatinine, serum  (enoxaparin (LOVENOX)    CrCl >/= 30 ml/min)  Once,   R       Comments: Baseline for enoxaparin therapy IF NOT ALREADY DRAWN.    11/25/22 0032   11/24/22 2308  Resp panel by RT-PCR (RSV, Flu A&B, Covid) Anterior Nasal Swab  Once,   URGENT        11/24/22 2307            Vitals/Pain Today's Vitals   11/24/22 2121 11/24/22 2122 11/24/22 2122 11/24/22 2352  BP:   (!) 132/95   Pulse:   85   Resp:   19   Temp:   98.5 F (36.9 C)   SpO2:   99%   Weight:  70.3 kg    Height:  5' (1.524 m)    PainSc: 8    0-No pain    Isolation Precautions No active isolations  Medications Medications  enoxaparin (LOVENOX) injection 40 mg (has no administration in time range)  lactated ringers infusion (has no administration in time range)  acetaminophen (TYLENOL) tablet  650 mg (has no administration in time range)  ondansetron (ZOFRAN) injection 4 mg (has no administration in time range)  prochlorperazine (COMPAZINE) injection 10 mg (has no administration in time range)  simethicone (MYLICON) chewable tablet 80 mg (has no administration in time range)  pantoprazole (PROTONIX) EC tablet 40 mg (has no administration in time range)  alum & mag hydroxide-simeth (MAALOX/MYLANTA) 200-200-20 MG/5ML suspension 30 mL (30 mLs Oral Given 11/24/22 2257)  sodium chloride 0.9 % bolus 1,000 mL (1,000 mLs Intravenous New Bag/Given 11/24/22 2259)  morphine (PF) 4 MG/ML injection 4 mg (4 mg Intravenous Given 11/24/22 2258)  ondansetron (ZOFRAN) injection 4 mg (4 mg Intravenous Given 11/24/22 2257)  iohexol (OMNIPAQUE) 350 MG/ML injection 75 mL (75 mLs Intravenous Contrast Given 11/24/22 2329)     Mobility walks     Focused Assessments GI   R Recommendations: See Admitting Provider Note  Report given to:   Additional Notes: Pt has appendicitis, and is spanish speaking

## 2022-11-25 NOTE — Progress Notes (Addendum)
Spanish Interpreter  Zenia Resides [phone]  Pain in ABD with vomiting and chills on Friday, Feb. 2 around 5 pm. Denies pain at this time.   NPO status explained. Blood pressure meds taken yesterday Admission questions   [place] [id number]  Explanation of call bell and fall prevention.

## 2022-11-25 NOTE — Progress Notes (Signed)
Patient has been discharged.

## 2022-11-25 NOTE — Plan of Care (Signed)

## 2022-11-25 NOTE — H&P (Addendum)
Admitting Physician: Nickola Major Zoya Sprecher  Service: General Surgery  CC: abdominal pain  Subjective   HPI: Loretta [name] is an 37 y.o. female who is here for abdominal pain.  The pain started around 5PM on 11/24/22 while driving her car.  Her pain was mostly epigastric and has improved since admission to the ER.  She has been trying to eat less recently and only had some coffee and very little to eat today.    Past Medical History:  Diagnosis Date   Acid reflux    Cholecystitis 08/2018   Hypertension     Past Surgical History:  Procedure Laterality Date   LAPAROSCOPIC CHOLECYSTECTOMY  05/07/2020   Dr. Redmond Pulling    History reviewed. No pertinent family history.  Social:  reports that she has never smoked. She has never used smokeless tobacco. She reports that she does not drink alcohol and does not use drugs.  Allergies: No Known Allergies  Medications: Current Outpatient Medications  Medication Instructions   amLODipine-valsartan (EXFORGE) 5-160 MG tablet 1 tablet, Oral, Daily   ciprofloxacin (CIPRO) 500 mg, Oral, 2 times daily   famotidine (PEPCID) 20 mg, Oral, 2 times daily   lansoprazole (PREVACID) 15 mg, Oral, Daily   oxyCODONE (OXY IR/ROXICODONE) 5 mg, Oral, Every 6 hours PRN    ROS - all of the below systems have been reviewed with the patient and positives are indicated with bold text General: chills, fever or night sweats Eyes: blurry vision or double vision ENT: epistaxis or sore throat Allergy/Immunology: itchy/watery eyes or nasal congestion Hematologic/Lymphatic: bleeding problems, blood clots or swollen lymph nodes Endocrine: temperature intolerance or unexpected weight changes Breast: new or changing breast lumps or nipple discharge Resp: cough, shortness of breath, or wheezing CV: chest pain or dyspnea on exertion GI: as per HPI GU: dysuria, trouble voiding, or hematuria MSK: joint pain or joint stiffness Neuro: TIA or stroke  symptoms Derm: pruritus and skin lesion changes Psych: anxiety and depression  Objective   PE Blood pressure (!) 132/95, pulse 85, temperature 98.5 F (36.9 C), resp. rate 19, height 5' (1.524 m), weight 70.3 kg, SpO2 99 %. Constitutional: NAD; conversant; no deformities Eyes: Moist conjunctiva; no lid lag; anicteric; PERRL Neck: Trachea midline; no thyromegaly Lungs: Normal respiratory effort; no tactile fremitus CV: RRR; no palpable thrills; no pitting edema GI: Abd Soft, nontender, nondistended; no palpable hepatosplenomegaly MSK: Normal range of motion of extremities; no clubbing/cyanosis Psychiatric: Appropriate affect; alert and oriented x3 Lymphatic: No palpable cervical or axillary lymphadenopathy  Results for orders placed or performed during the hospital encounter of 11/24/22 (from the past 24 hour(s))  CBC with Differential     Status: Abnormal   Collection Time: 11/24/22  9:34 PM  Result Value Ref Range   WBC 17.0 (H) 4.0 - 10.5 K/uL   RBC 4.74 3.87 - 5.11 MIL/uL   Hemoglobin 14.0 12.0 - 15.0 g/dL   HCT 40.8 36.0 - 46.0 %   MCV 86.1 80.0 - 100.0 fL   MCH 29.5 26.0 - 34.0 pg   MCHC 34.3 30.0 - 36.0 g/dL   RDW 12.5 11.5 - 15.5 %   Platelets 369 150 - 400 K/uL   nRBC 0.0 0.0 - 0.2 %   Neutrophils Relative % 85 %   Neutro Abs 14.5 (H) 1.7 - 7.7 K/uL   Lymphocytes Relative 11 %   Lymphs Abs 1.8 0.7 - 4.0 K/uL   Monocytes Relative 4 %   Monocytes Absolute 0.6 0.1 -  1.0 K/uL   Eosinophils Relative 0 %   Eosinophils Absolute 0.0 0.0 - 0.5 K/uL   Basophils Relative 0 %   Basophils Absolute 0.0 0.0 - 0.1 K/uL   Immature Granulocytes 0 %   Abs Immature Granulocytes 0.05 0.00 - 0.07 K/uL  Comprehensive metabolic panel     Status: Abnormal   Collection Time: 11/24/22  9:34 PM  Result Value Ref Range   Sodium 132 (L) 135 - 145 mmol/L   Potassium 4.1 3.5 - 5.1 mmol/L   Chloride 98 98 - 111 mmol/L   CO2 21 (L) 22 - 32 mmol/L   Glucose, Bld 126 (H) 70 - 99 mg/dL   BUN  15 6 - 20 mg/dL   Creatinine, Ser 0.72 0.44 - 1.00 mg/dL   Calcium 9.1 8.9 - 10.3 mg/dL   Total Protein 7.5 6.5 - 8.1 g/dL   Albumin 4.1 3.5 - 5.0 g/dL   AST 23 15 - 41 U/L   ALT 29 0 - 44 U/L   Alkaline Phosphatase 63 38 - 126 U/L   Total Bilirubin 0.7 0.3 - 1.2 mg/dL   GFR, Estimated >60 >60 mL/min   Anion gap 13 5 - 15  Lipase, blood     Status: None   Collection Time: 11/24/22  9:34 PM  Result Value Ref Range   Lipase 30 11 - 51 U/L  Urinalysis, Routine w reflex microscopic -Urine, Clean Catch     Status: Abnormal   Collection Time: 11/24/22  9:35 PM  Result Value Ref Range   Color, Urine YELLOW YELLOW   APPearance HAZY (A) CLEAR   Specific Gravity, Urine 1.030 1.005 - 1.030   pH 5.0 5.0 - 8.0   Glucose, UA NEGATIVE NEGATIVE mg/dL   Hgb urine dipstick NEGATIVE NEGATIVE   Bilirubin Urine NEGATIVE NEGATIVE   Ketones, ur 80 (A) NEGATIVE mg/dL   Protein, ur 30 (A) NEGATIVE mg/dL   Nitrite NEGATIVE NEGATIVE   Leukocytes,Ua TRACE (A) NEGATIVE   RBC / HPF 6-10 0 - 5 RBC/hpf   WBC, UA 0-5 0 - 5 WBC/hpf   Bacteria, UA NONE SEEN NONE SEEN   Squamous Epithelial / HPF 6-10 0 - 5 /HPF   Mucus PRESENT   POC beta hCG blood, ED     Status: None   Collection Time: 11/24/22 10:09 PM  Result Value Ref Range   I-stat hCG, quantitative <5.0 <5 mIU/mL   Comment 3            Imaging Orders         CT ABDOMEN PELVIS W CONTRAST     Mildly distended appendix measuring 7 mm with surrounding fat stranding, possible early appendicitis. Surgical consultation is recommended.  Assessment and Plan   Loretta [name] is an 37 y.o. female with abdominal pain, leukocytosis, and CT scan concerning for early mild appendicitis.  Her abdominal pain has improved and her abdominal exam is benign with no tenderness over the appendix.  I'm not sure if she has appendicitis or another cause of her abdominal pain.  I recommend admission with observation overnight.  If her pain and leukocytosis  improves she may not need appendectomy.  I will admit her to the hospital on the general surgery service and we will re-evaluate in the morning.  I would like to monitor her off antibiotics to see if her symptoms change.    ICD-10-CM   1. Acute appendicitis, unspecified acute appendicitis type  K35.80  Felicie Morn, MD  [facility] Use [email or web address] to contact on call provider  New Patient Billing: [phone] - High MDM

## 2022-12-18 ENCOUNTER — Encounter: Payer: Self-pay | Admitting: Family Medicine

## 2022-12-18 ENCOUNTER — Ambulatory Visit (INDEPENDENT_AMBULATORY_CARE_PROVIDER_SITE_OTHER): Payer: Medicaid Other | Admitting: Family Medicine

## 2022-12-18 VITALS — BP 138/89 | HR 108 | Ht 60.0 in | Wt 154.5 lb

## 2022-12-18 DIAGNOSIS — R42 Dizziness and giddiness: Secondary | ICD-10-CM

## 2022-12-18 DIAGNOSIS — R109 Unspecified abdominal pain: Secondary | ICD-10-CM

## 2022-12-18 DIAGNOSIS — I10 Essential (primary) hypertension: Secondary | ICD-10-CM | POA: Diagnosis not present

## 2022-12-18 MED ORDER — VALSARTAN 160 MG PO TABS: 160.0000 mg | ORAL_TABLET | Freq: Every day | ORAL | 0 refills | Status: DC

## 2022-12-18 NOTE — Patient Instructions (Addendum)
It was nice seeing you today!  Switch your medication to valsartan 160 mg once a day.  If you are having more abdominal pain, come back and see me. -- [place] su medicamento a valsartn 160 mg una [place].  Si tienes ms dolor abdominal, vuelve a verme.  Stay well, Zola Button, MD [place] [phone]  --  Make sure to check out at the front desk before you leave today.  Please arrive at least 15 minutes prior to your scheduled appointments.  If you had blood work today, I will send you a MyChart message or a letter if results are normal. Otherwise, I will give you a call.  If you had a referral placed, they will call you to set up an appointment. Please give [place] a call if you don't hear back in the next 2 weeks.  If you need additional refills before your next appointment, please call your pharmacy first.

## 2022-12-18 NOTE — Assessment & Plan Note (Signed)
Currently resolved.  She will let me know if she develops further abdominal pain.

## 2022-12-18 NOTE — Progress Notes (Signed)
    SUBJECTIVE:   CHIEF COMPLAINT / HPI:  Chief Complaint  Patient presents with   Fatigue    Spanish interpreter by video used  Has been feeling generally weak, feels as if about to faint maybe 1-2 times a day. Ongoing for about 2 months. Episodes last 30-40 minutes, gets better after sitting down to rest. Home BP readings: 100s-110s/70s. Has taken BP readings during episodes, 100s/70s.  Patient was recently admitted a few weeks ago overnight to the hospital for abdominal pain, CT showed possible signs of appendicitis.  Pain resolved and she did not require surgery or antibiotics. Since then she reports that she has not had any further abdominal pain.  PERTINENT  PMH / PSH: HTN, GERD  Patient Care Team: Zola Button, MD as PCP - General (Family Medicine)   OBJECTIVE:   BP 138/89   Pulse (!) 108   Ht 5' (1.524 m)   Wt 154 lb 8 oz (70.1 kg)   LMP 12/05/2022   SpO2 100%   BMI 30.17 kg/m   Physical Exam Constitutional:      General: She is not in acute distress. Cardiovascular:     Rate and Rhythm: Normal rate and regular rhythm.  Pulmonary:     Effort: Pulmonary effort is normal. No respiratory distress.     Breath sounds: Normal breath sounds.  Abdominal:     General: Bowel sounds are normal.     Palpations: Abdomen is soft.     Tenderness: There is no abdominal tenderness.  Musculoskeletal:     Cervical back: Neck supple.  Neurological:     Mental Status: She is alert.         12/18/2022   10:07 AM  Depression screen PHQ 2/9  Decreased Interest 1  Down, Depressed, Hopeless 0  PHQ - 2 Score 1  Altered sleeping 0  Tired, decreased energy 1  Change in appetite 0  Feeling bad or failure about yourself  0  Trouble concentrating 0  Moving slowly or fidgety/restless 0  Suicidal thoughts 0  PHQ-9 Score 2  Difficult doing work/chores Not difficult at all    Orthostatic VS for the past 24 hrs:  BP- Lying Pulse- Lying BP- Sitting Pulse- Sitting BP- Standing  at 0 minutes Pulse- Standing at 0 minutes  12/18/22 1030 151/82 116 141/83 119 135/89 125      {Show previous vital signs (optional):23777}    ASSESSMENT/PLAN:   Hypertension Intermittent lightheadedness possibly due to medications.  Borderline normal orthostatics (SBP drop of 16 points from lying to standing).  Will lower antihypertensives and follow-up in 1 month, consider further workup if not improving. - switch amlodipine-valsartan 5-160 mg to valsartan 160 mg  Abdominal pain Currently resolved.  She will let me know if she develops further abdominal pain.    Return in about 4 weeks (around 01/15/2023) for f/u HTN, lightheadedness.   Zola Button, MD [place]

## 2022-12-18 NOTE — Assessment & Plan Note (Signed)
Intermittent lightheadedness possibly due to medications.  Borderline normal orthostatics (SBP drop of 16 points from lying to standing).  Will lower antihypertensives and follow-up in 1 month, consider further workup if not improving. - switch amlodipine-valsartan 5-160 mg to valsartan 160 mg

## 2022-12-22 DIAGNOSIS — Z419 Encounter for procedure for purposes other than remedying health state, unspecified: Secondary | ICD-10-CM | POA: Diagnosis not present

## 2023-01-15 NOTE — Patient Instructions (Incomplete)
It was nice seeing you today!  If you want to make sure your blood pressure cuff is accurate, you can bring in your blood pressure cuff next time you come to the office.  Glad you are doing better.  See you in 6 months. -- Si desea asegurarse de que su manguito de presin arterial sea exacto, puede traerlo la prxima vez que venga al [employer].  Me alegra que ests mejor. Nos vemos en 6 meses.  Stay well, Zola Button, MD [place] [phone]  --  Make sure to check out at the front desk before you leave today.  Please arrive at least 15 minutes prior to your scheduled appointments.  If you had blood work today, I will send you a MyChart message or a letter if results are normal. Otherwise, I will give you a call.  If you had a referral placed, they will call you to set up an appointment. Please give [place] a call if you don't hear back in the next 2 weeks.  If you need additional refills before your next appointment, please call your pharmacy first.

## 2023-01-15 NOTE — Progress Notes (Unsigned)
    SUBJECTIVE:   CHIEF COMPLAINT / HPI:  No chief complaint on file.   Last visit patient was seen for lightheadedness which I thought was possibly due to orthostatics.  I reduced her  blood pressure medications, now only on valsartan 160 mg. Today, reports her lightheadedness has resolved. Home readings: 100s-110s/70s.  Patient reports she has not had any further abdominal pain.  PERTINENT  PMH / PSH: HTN  Patient Care Team: Zola Button, MD as PCP - General (Family Medicine)   OBJECTIVE:   BP (!) 140/90   Pulse (!) 101   Ht 5' (1.524 m)   Wt 153 lb 2 oz (69.5 kg)   LMP 12/30/2022   SpO2 99%   BMI 29.91 kg/m   Physical Exam Constitutional:      General: She is not in acute distress. Cardiovascular:     Rate and Rhythm: Normal rate and regular rhythm.     Heart sounds: Normal heart sounds.  Pulmonary:     Effort: Pulmonary effort is normal. No respiratory distress.     Breath sounds: Normal breath sounds.  Musculoskeletal:     Cervical back: Neck supple.  Neurological:     Mental Status: She is alert.         12/18/2022   10:07 AM  Depression screen PHQ 2/9  Decreased Interest 1  Down, Depressed, Hopeless 0  PHQ - 2 Score 1  Altered sleeping 0  Tired, decreased energy 1  Change in appetite 0  Feeling bad or failure about yourself  0  Trouble concentrating 0  Moving slowly or fidgety/restless 0  Suicidal thoughts 0  PHQ-9 Score 2  Difficult doing work/chores Not difficult at all     {Show previous vital signs (optional):23777}    ASSESSMENT/PLAN:   Hypertension Lightheadedness resolved after reducing medications.  Still well-controlled.  Will continue current medications. - continue valsartan 160 mg daily    Return in about 6 months (around 07/20/2023) for f/u HTN.   Zola Button, MD [place]

## 2023-01-17 ENCOUNTER — Ambulatory Visit: Payer: Medicaid Other | Admitting: Family Medicine

## 2023-01-17 ENCOUNTER — Encounter: Payer: Self-pay | Admitting: Family Medicine

## 2023-01-17 VITALS — BP 134/90 | HR 87 | Ht 60.0 in | Wt 153.1 lb

## 2023-01-17 DIAGNOSIS — I10 Essential (primary) hypertension: Secondary | ICD-10-CM | POA: Diagnosis not present

## 2023-01-17 NOTE — Assessment & Plan Note (Signed)
Lightheadedness resolved after reducing medications.  Still well-controlled.  Will continue current medications. - continue valsartan 160 mg daily

## 2023-01-22 DIAGNOSIS — Z419 Encounter for procedure for purposes other than remedying health state, unspecified: Secondary | ICD-10-CM | POA: Diagnosis not present

## 2023-02-21 DIAGNOSIS — Z419 Encounter for procedure for purposes other than remedying health state, unspecified: Secondary | ICD-10-CM | POA: Diagnosis not present

## 2023-03-16 ENCOUNTER — Other Ambulatory Visit: Payer: Self-pay | Admitting: Family Medicine

## 2023-03-16 MED ORDER — VALSARTAN 160 MG PO TABS: 160.0000 mg | ORAL_TABLET | Freq: Every day | ORAL | 0 refills | Status: DC

## 2023-03-24 DIAGNOSIS — Z419 Encounter for procedure for purposes other than remedying health state, unspecified: Secondary | ICD-10-CM | POA: Diagnosis not present

## 2023-04-23 DIAGNOSIS — Z419 Encounter for procedure for purposes other than remedying health state, unspecified: Secondary | ICD-10-CM | POA: Diagnosis not present

## 2023-05-24 DIAGNOSIS — Z419 Encounter for procedure for purposes other than remedying health state, unspecified: Secondary | ICD-10-CM | POA: Diagnosis not present

## 2023-06-07 ENCOUNTER — Other Ambulatory Visit: Payer: Self-pay | Admitting: Family Medicine

## 2023-06-07 ENCOUNTER — Other Ambulatory Visit: Payer: Self-pay

## 2023-06-07 DIAGNOSIS — I10 Essential (primary) hypertension: Secondary | ICD-10-CM

## 2023-06-07 MED ORDER — VALSARTAN 160 MG PO TABS: 160.0000 mg | ORAL_TABLET | Freq: Every day | ORAL | 0 refills | Status: DC

## 2023-06-11 MED ORDER — VALSARTAN 160 MG PO TABS: 160.0000 mg | ORAL_TABLET | Freq: Every day | ORAL | 0 refills | Status: DC

## 2023-06-24 DIAGNOSIS — Z419 Encounter for procedure for purposes other than remedying health state, unspecified: Secondary | ICD-10-CM | POA: Diagnosis not present

## 2023-06-27 ENCOUNTER — Ambulatory Visit (INDEPENDENT_AMBULATORY_CARE_PROVIDER_SITE_OTHER): Payer: Medicaid Other | Admitting: Family Medicine

## 2023-06-27 ENCOUNTER — Other Ambulatory Visit (HOSPITAL_COMMUNITY)
Admission: RE | Admit: 2023-06-27 | Discharge: 2023-06-27 | Disposition: A | Discharge status: Home / Self Care | Payer: Medicaid Other | Source: Ambulatory Visit | Attending: Family Medicine | Admitting: Family Medicine

## 2023-06-27 ENCOUNTER — Encounter: Payer: Self-pay | Admitting: Family Medicine

## 2023-06-27 VITALS — BP 127/86 | HR 100 | Ht 60.0 in | Wt 160.2 lb

## 2023-06-27 DIAGNOSIS — R350 Frequency of micturition: Secondary | ICD-10-CM | POA: Insufficient documentation

## 2023-06-27 DIAGNOSIS — R300 Dysuria: Secondary | ICD-10-CM | POA: Diagnosis not present

## 2023-06-27 DIAGNOSIS — I10 Essential (primary) hypertension: Secondary | ICD-10-CM | POA: Diagnosis not present

## 2023-06-27 LAB — POCT UA - MICROSCOPIC ONLY: WBC, Ur, HPF, POC: NONE SEEN (ref 0–5)

## 2023-06-27 LAB — POCT URINALYSIS DIP (MANUAL ENTRY)
Bilirubin, UA: NEGATIVE
Glucose, UA: NEGATIVE mg/dL
Ketones, POC UA: NEGATIVE mg/dL
Leukocytes, UA: NEGATIVE
Nitrite, UA: NEGATIVE
Protein Ur, POC: NEGATIVE mg/dL
Spec Grav, UA: 1.01 (ref 1.010–1.025)
Urobilinogen, UA: 0.2 U/dL
pH, UA: 7 (ref 5.0–8.0)

## 2023-06-27 LAB — POCT WET PREP (WET MOUNT)
Clue Cells Wet Prep Whiff POC: NEGATIVE
Trichomonas Wet Prep HPF POC: ABSENT

## 2023-06-27 NOTE — Assessment & Plan Note (Addendum)
Well controlled on valsartan 160 mg when BP rechecked today, though initially elevated.  Will not increase meds at this time given past orthostatic hypotension. -Requested patient keep home BP diary -Follow-up in 3 months -Consider TSH and anemia workup if still tachycardic at return visit

## 2023-06-27 NOTE — Assessment & Plan Note (Signed)
Physical exam significant for some white discharge.  Wet prep performed today shows mod bacteria, negative whiff, no concern for infection.  UA not consistent with UTI.  Possible STI, but less likely given sexual history.  Low concern for urethritis, cystitis. -f/u GC/chlamydia swab -Discussed protection during intercourse and contraceptive methods

## 2023-06-27 NOTE — Progress Notes (Signed)
SUBJECTIVE:   Spanish video interpreter used: Mathis Fare, #[id number]  CHIEF COMPLAINT / HPI:  Chief Complaint  Patient presents with   Blood Pressure Check   Loretta [name] is a 37 y.o. female with a pertinent past medical history of HTN, GERD presenting to the clinic for blood pressure follow-up and new dysuria.  Hypertension BP: 127/86 on recheck today (initially 150/86). Home medications include: valsartan 160 mg daily. She endorses taking these medications as prescribed. Does not check blood pressure at home.  Last checked a few months ago. Diet: contains a fair amount of sodium. Exercise: minimal. Patient has had a BMP in the past 1 year, no recent BP med changes to warrant recheck. Previously experienced symptomatic orthostatic hypotension on amlodipine-valsartan 5-160 mg, was reduced to current dose in February.  Dysuria Has been going on for 2 weeks. Was having lumbar back pain lateral to spine. No fevers, no chills, other systemic symptoms. Has noticed some whitish vaginal discharge starting yesterday. Is interested in screening for sexually transmitted infections today. Not sexually active in 3 months, but prior to that was active with her husband, monogamous. Does not use condoms or other forms of birth control.   PERTINENT PMH / PSH: HTN, GERD Lives with husband and daughter, monogamous relationship.   OBJECTIVE:   BP 127/86   Pulse 100   Ht 5' (1.524 m)   Wt 160 lb 3.2 oz (72.7 kg)   LMP 06/18/2023   SpO2 100%   BMI 31.29 kg/m   General: Age-appropriate, resting comfortably in chair, NAD, alert and at baseline. Cardiovascular: Borderline tachycardic. Normal S1/S2. No murmurs, rubs, or gallops appreciated. 2+ radial pulses. Pulmonary: Clear bilaterally to ascultation. No increased WOB, no accessory muscle usage. No wheezes, crackles, or rhonchi. Extremities: No peripheral edema bilaterally. Capillary refill <2 seconds.  Pelvic exam:  Vulva:  Normal appearing vulva with no masses, tenderness or lesions. Vagina: Normal appearing vagina with normal color, no lesions, with scant discharge posteriorly. Cervix: No lesions, mild white discharge present at cervical os.  Chaperone Cleatrice Burke, CMA present for pelvic exam.  Supervised by Dr. Miquel Dunn.  Results for orders placed or performed in visit on 06/27/23 (from the past 48 hour(s))  POCT Wet Prep Mellody Drown [place])     Status: Abnormal   Collection Time: 06/27/23  9:50 AM  Result Value Ref Range   Source Wet Prep POC VAG    WBC, Wet Prep HPF POC 0-5    Bacteria Wet Prep HPF POC Moderate (A) Few   Clue Cells Wet Prep HPF POC None None   Clue Cells Wet Prep Whiff POC Negative Whiff    Yeast Wet Prep HPF POC None None   KOH Wet Prep POC None None   Trichomonas Wet Prep HPF POC Absent Absent  POCT urinalysis dipstick     Status: Abnormal   Collection Time: 06/27/23 10:10 AM  Result Value Ref Range   Color, UA yellow yellow   Clarity, UA clear clear   Glucose, UA negative negative mg/dL   Bilirubin, UA negative negative   Ketones, POC UA negative negative mg/dL   Spec Grav, UA 3.086 5.784 - 1.025   Blood, UA trace-intact (A) negative   pH, UA 7.0 5.0 - 8.0   Protein Ur, POC negative negative mg/dL   Urobilinogen, UA 0.2 0.2 or 1.0 E.U./dL   Nitrite, UA Negative Negative   Leukocytes, UA Negative Negative  POCT UA - Microscopic Only     Status: None  Collection Time: 06/27/23 10:10 AM  Result Value Ref Range   WBC, Ur, HPF, POC NONE SEEN 0 - 5   RBC, Urine, Miroscopic 0-2 0 - 2   Bacteria, U Microscopic TRACE None - Trace   Epithelial cells, urine per micros 5-10     ASSESSMENT/PLAN:   Dysuria Physical exam significant for some white discharge.  Wet prep performed today shows mod bacteria, negative whiff, no concern for infection.  UA not consistent with UTI.  Possible STI, but less likely given sexual history.  Low concern for urethritis, cystitis. -f/u GC/chlamydia  swab -Discussed protection during intercourse and contraceptive methods  Hypertension Well controlled on valsartan 160 mg when BP rechecked today, though initially elevated.  Will not increase meds at this time given past orthostatic hypotension. -Requested patient keep home BP diary -Follow-up in 3 months -Consider TSH and anemia workup if still tachycardic at return visit  Return in about 3 months (around 09/26/2023).  Zacory Fiola Sharion Dove, MD [facility]

## 2023-06-27 NOTE — Patient Instructions (Addendum)
It was great to see you today! Thank you for choosing [facility] for your primary care.  Today we addressed: Burning with urination I suspect this may be a urinary tract infection.  We will do some tests and I will call you with the results and any medicines you need to take.  Blood pressure When we checked your blood pressure again, it looked better! Please start taking your blood pressure daily at home and write down the numbers.  I would like you to come back in 3 months to make sure things are going well.  You should return to our clinic in 3 months.  Thank you for coming to see [place] at [facility] and for the opportunity to care for you! Sharion Dove, Layia Walla, MD 06/27/2023, 10:06 AM   _____________________________  Domingo Cocking verte hoy! Karl Pock por elegir [facility] para su atencin primaria.  Hoy abordamos: [place] al [employer] que puede ser una infeccin del tracto urinario.  Le haremos algunas pruebas y lo llamar con los resultados y los medicamentos que necesita tomar.  Presin arterial Cuando volvimos a controlar su presin arterial, se vea mejor! Empiece a tomarse la presin arterial diariamente en casa y anote los nmeros.  Me gustara que volvieras en 3 meses para asegurarme de que todo va bien.  Deberas regresar a nuestra clnica en 3 meses.  Gracias por venir a vernos a [facility] y por la oportunidad de cuidar de usted! Sharion Dove, Kasiyah Platter, MD 01/30/2023, 10:06

## 2023-06-29 LAB — CERVICOVAGINAL ANCILLARY ONLY
Chlamydia: NEGATIVE
Comment: NEGATIVE
Comment: NEGATIVE
Comment: NORMAL
Neisseria Gonorrhea: NEGATIVE
Trichomonas: NEGATIVE

## 2023-07-02 ENCOUNTER — Telehealth: Payer: Self-pay | Admitting: Family Medicine

## 2023-07-02 NOTE — Telephone Encounter (Signed)
Spanish interpreter used, ID [id number].  GC, chlamydia, trich, and Whiff test for BV all negative.  Called patient and left voicemail stating that all of her labs were normal from the visit.  Requested that she call back to our clinic to schedule another appointment if she is still having her dysuria symptoms.

## 2023-07-24 DIAGNOSIS — Z419 Encounter for procedure for purposes other than remedying health state, unspecified: Secondary | ICD-10-CM | POA: Diagnosis not present

## 2023-08-24 DIAGNOSIS — Z419 Encounter for procedure for purposes other than remedying health state, unspecified: Secondary | ICD-10-CM | POA: Diagnosis not present

## 2023-09-23 DIAGNOSIS — Z419 Encounter for procedure for purposes other than remedying health state, unspecified: Secondary | ICD-10-CM | POA: Diagnosis not present

## 2023-10-24 DIAGNOSIS — Z419 Encounter for procedure for purposes other than remedying health state, unspecified: Secondary | ICD-10-CM | POA: Diagnosis not present

## 2023-11-24 DIAGNOSIS — Z419 Encounter for procedure for purposes other than remedying health state, unspecified: Secondary | ICD-10-CM | POA: Diagnosis not present

## 2023-12-01 ENCOUNTER — Other Ambulatory Visit: Payer: Self-pay | Admitting: Family Medicine

## 2023-12-01 DIAGNOSIS — I10 Essential (primary) hypertension: Secondary | ICD-10-CM

## 2023-12-04 ENCOUNTER — Other Ambulatory Visit: Payer: Self-pay | Admitting: Family Medicine

## 2023-12-04 DIAGNOSIS — I10 Essential (primary) hypertension: Secondary | ICD-10-CM

## 2023-12-04 MED ORDER — VALSARTAN 160 MG PO TABS: 160.0000 mg | ORAL_TABLET | Freq: Every day | ORAL | 1 refills | Status: AC

## 2023-12-07 MED ORDER — VALSARTAN 160 MG PO TABS: 160.0000 mg | ORAL_TABLET | Freq: Every day | ORAL | 3 refills | Status: AC

## 2023-12-22 DIAGNOSIS — Z419 Encounter for procedure for purposes other than remedying health state, unspecified: Secondary | ICD-10-CM | POA: Diagnosis not present

## 2024-02-02 DIAGNOSIS — Z419 Encounter for procedure for purposes other than remedying health state, unspecified: Secondary | ICD-10-CM | POA: Diagnosis not present

## 2024-03-03 DIAGNOSIS — Z419 Encounter for procedure for purposes other than remedying health state, unspecified: Secondary | ICD-10-CM | POA: Diagnosis not present

## 2024-04-03 DIAGNOSIS — Z419 Encounter for procedure for purposes other than remedying health state, unspecified: Secondary | ICD-10-CM | POA: Diagnosis not present

## 2024-05-03 DIAGNOSIS — Z419 Encounter for procedure for purposes other than remedying health state, unspecified: Secondary | ICD-10-CM | POA: Diagnosis not present

## 2024-06-03 DIAGNOSIS — Z419 Encounter for procedure for purposes other than remedying health state, unspecified: Secondary | ICD-10-CM | POA: Diagnosis not present

## 2024-07-04 DIAGNOSIS — Z419 Encounter for procedure for purposes other than remedying health state, unspecified: Secondary | ICD-10-CM | POA: Diagnosis not present

## 2024-10-03 DIAGNOSIS — Z419 Encounter for procedure for purposes other than remedying health state, unspecified: Secondary | ICD-10-CM | POA: Diagnosis not present
